# Patient Record
Sex: Male | Born: 1960 | Race: White | Hispanic: No | Marital: Married | Smoking: Former smoker
Health system: Southern US, Community
[De-identification: ages and names within clinical notes are randomized; demographics above are authoritative.]

## PROBLEM LIST (undated history)

## (undated) DIAGNOSIS — F329 Major depressive disorder, single episode, unspecified: Secondary | ICD-10-CM

## (undated) DIAGNOSIS — R4689 Other symptoms and signs involving appearance and behavior: Secondary | ICD-10-CM

## (undated) DIAGNOSIS — J449 Chronic obstructive pulmonary disease, unspecified: Secondary | ICD-10-CM

## (undated) DIAGNOSIS — F419 Anxiety disorder, unspecified: Secondary | ICD-10-CM

## (undated) DIAGNOSIS — N189 Chronic kidney disease, unspecified: Secondary | ICD-10-CM

## (undated) DIAGNOSIS — E039 Hypothyroidism, unspecified: Secondary | ICD-10-CM

## (undated) DIAGNOSIS — J189 Pneumonia, unspecified organism: Secondary | ICD-10-CM

## (undated) DIAGNOSIS — K219 Gastro-esophageal reflux disease without esophagitis: Secondary | ICD-10-CM

## (undated) DIAGNOSIS — E079 Disorder of thyroid, unspecified: Secondary | ICD-10-CM

## (undated) DIAGNOSIS — R4589 Other symptoms and signs involving emotional state: Secondary | ICD-10-CM

## (undated) DIAGNOSIS — G4730 Sleep apnea, unspecified: Secondary | ICD-10-CM

## (undated) DIAGNOSIS — F32A Depression, unspecified: Secondary | ICD-10-CM

## (undated) DIAGNOSIS — R0600 Dyspnea, unspecified: Secondary | ICD-10-CM

## (undated) HISTORY — PX: HERNIA REPAIR: SHX51

## (undated) HISTORY — PX: NEPHRECTOMY: SHX65

## (undated) HISTORY — PX: CHOLECYSTECTOMY: SHX55

---

## 2003-04-29 ENCOUNTER — Emergency Department (HOSPITAL_COMMUNITY): Admission: EM | Admit: 2003-04-29 | Discharge: 2003-04-29 | Payer: Self-pay | Admitting: Emergency Medicine

## 2017-09-09 ENCOUNTER — Emergency Department (HOSPITAL_COMMUNITY)
Admission: EM | Admit: 2017-09-09 | Discharge: 2017-09-10 | Disposition: A | Discharge status: Other Acute Inpatient Hospital | Payer: Medicare HMO | Attending: Psychiatry | Admitting: Psychiatry

## 2017-09-09 ENCOUNTER — Encounter (HOSPITAL_COMMUNITY): Payer: Self-pay | Admitting: *Deleted

## 2017-09-09 DIAGNOSIS — Z87891 Personal history of nicotine dependence: Secondary | ICD-10-CM | POA: Insufficient documentation

## 2017-09-09 DIAGNOSIS — R45851 Suicidal ideations: Secondary | ICD-10-CM

## 2017-09-09 DIAGNOSIS — F329 Major depressive disorder, single episode, unspecified: Secondary | ICD-10-CM | POA: Insufficient documentation

## 2017-09-09 DIAGNOSIS — Z9049 Acquired absence of other specified parts of digestive tract: Secondary | ICD-10-CM | POA: Insufficient documentation

## 2017-09-09 HISTORY — DX: Other symptoms and signs involving emotional state: R45.89

## 2017-09-09 HISTORY — DX: Major depressive disorder, single episode, unspecified: F32.9

## 2017-09-09 HISTORY — DX: Disorder of thyroid, unspecified: E07.9

## 2017-09-09 HISTORY — DX: Depression, unspecified: F32.A

## 2017-09-09 HISTORY — DX: Other symptoms and signs involving appearance and behavior: R46.89

## 2017-09-09 LAB — RAPID URINE DRUG SCREEN, HOSP PERFORMED
Amphetamines: NOT DETECTED
Barbiturates: NOT DETECTED
Benzodiazepines: NOT DETECTED
Cocaine: NOT DETECTED
Opiates: NOT DETECTED
Tetrahydrocannabinol: NOT DETECTED

## 2017-09-09 LAB — COMPREHENSIVE METABOLIC PANEL
ALT: 20 U/L (ref 17–63)
AST: 24 U/L (ref 15–41)
Albumin: 4.2 g/dL (ref 3.5–5.0)
Alkaline Phosphatase: 105 U/L (ref 38–126)
Anion gap: 9 (ref 5–15)
BILIRUBIN TOTAL: 0.7 mg/dL (ref 0.3–1.2)
BUN: 19 mg/dL (ref 6–20)
CO2: 27 mmol/L (ref 22–32)
CREATININE: 1.41 mg/dL — AB (ref 0.61–1.24)
Calcium: 9.3 mg/dL (ref 8.9–10.3)
Chloride: 105 mmol/L (ref 101–111)
GFR calc Af Amer: >60 mL/min (ref >60)
GFR, EST NON AFRICAN AMERICAN: 55 mL/min — AB (ref >60)
Glucose, Bld: 103 mg/dL — ABNORMAL HIGH (ref 65–99)
Potassium: 4 mmol/L (ref 3.5–5.1)
Sodium: 141 mmol/L (ref 135–145)
TOTAL PROTEIN: 7.4 g/dL (ref 6.5–8.1)

## 2017-09-09 LAB — CBC
HCT: 44.8 % (ref 39.0–52.0)
Hemoglobin: 14.4 g/dL (ref 13.0–17.0)
MCH: 28.9 pg (ref 26.0–34.0)
MCHC: 32.1 g/dL (ref 30.0–36.0)
MCV: 90 fL (ref 78.0–100.0)
PLATELETS: 191 10*3/uL (ref 150–400)
RBC: 4.98 MIL/uL (ref 4.22–5.81)
RDW: 13.9 % (ref 11.5–15.5)
WBC: 9 10*3/uL (ref 4.0–10.5)

## 2017-09-09 LAB — SALICYLATE LEVEL: Salicylate Lvl: <7 mg/dL (ref 2.8–30.0)

## 2017-09-09 LAB — ACETAMINOPHEN LEVEL: Acetaminophen (Tylenol), Serum: <10 ug/mL — ABNORMAL LOW (ref 10–30)

## 2017-09-09 LAB — ETHANOL

## 2017-09-09 MED ORDER — ASPIRIN EC 81 MG PO TBEC
81.0000 mg | DELAYED_RELEASE_TABLET | Freq: Every day | ORAL | Status: DC
  Administered 2017-09-09: 81 mg via ORAL
  Filled 2017-09-09: qty 1

## 2017-09-09 MED ORDER — BUSPIRONE HCL 10 MG PO TABS
10.0000 mg | ORAL_TABLET | Freq: Three times a day (TID) | ORAL | Status: DC
  Administered 2017-09-09 – 2017-09-10 (×3): 10 mg via ORAL
  Filled 2017-09-09 (×3): qty 1

## 2017-09-09 MED ORDER — ONDANSETRON HCL 4 MG PO TABS: 4.0000 mg | ORAL_TABLET | Freq: Three times a day (TID) | ORAL | Status: DC | PRN

## 2017-09-09 MED ORDER — DOCUSATE SODIUM 100 MG PO CAPS: 100.0000 mg | ORAL_CAPSULE | Freq: Every day | ORAL | Status: DC | PRN

## 2017-09-09 MED ORDER — FAMOTIDINE 20 MG PO TABS
20.0000 mg | ORAL_TABLET | Freq: Every morning | ORAL | Status: DC
  Administered 2017-09-10: 20 mg via ORAL
  Filled 2017-09-09: qty 1

## 2017-09-09 MED ORDER — ACETAMINOPHEN 325 MG PO TABS: 650.0000 mg | ORAL_TABLET | ORAL | Status: DC | PRN

## 2017-09-09 MED ORDER — ACETAMINOPHEN 325 MG PO TABS: 325.0000 mg | ORAL_TABLET | Freq: Four times a day (QID) | ORAL | Status: DC | PRN

## 2017-09-09 MED ORDER — RISPERIDONE 0.5 MG PO TABS
0.5000 mg | ORAL_TABLET | Freq: Every day | ORAL | Status: DC
  Administered 2017-09-09: 0.5 mg via ORAL
  Filled 2017-09-09: qty 1

## 2017-09-09 MED ORDER — CITALOPRAM HYDROBROMIDE 10 MG PO TABS
20.0000 mg | ORAL_TABLET | ORAL | Status: DC
  Administered 2017-09-10: 20 mg via ORAL
  Filled 2017-09-09: qty 2

## 2017-09-09 MED ORDER — ATORVASTATIN CALCIUM 40 MG PO TABS
40.0000 mg | ORAL_TABLET | Freq: Every day | ORAL | Status: DC
  Administered 2017-09-09: 40 mg via ORAL
  Filled 2017-09-09: qty 1

## 2017-09-09 MED ORDER — LEVOTHYROXINE SODIUM 112 MCG PO TABS
112.0000 ug | ORAL_TABLET | Freq: Every day | ORAL | Status: DC
  Administered 2017-09-10: 112 ug via ORAL
  Filled 2017-09-09: qty 1
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
## 2017-09-09 NOTE — ED Notes (Signed)
Pt and spouse aware TTS to be performed soon. Pt c/o "I'm hurting all over". States "it just started". Pt then stated his posterior neck was itching - tag removed from shirt - spouse states she thinks that was the issue. Pt stated "If this keeps up, I'm not going to be able to sleep tonight". Advised pt it's a little early to be worrying about that. Pt states, "Well, I zonk out anyway". Pt and spouse watching tv and talking.
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
## 2017-09-09 NOTE — ED Notes (Signed)
Pt and spouse aware of tx plan - seeking inpt tx. Spouse leaving and taking all of pt's belongings w/her. Pt only has eyeglasses on bedside table.

## 2017-09-10 ENCOUNTER — Inpatient Hospital Stay (HOSPITAL_COMMUNITY)
Admission: AD | Admit: 2017-09-10 | Discharge: 2017-09-15 | DRG: 885 | Disposition: A | Discharge status: Home / Self Care | Payer: Medicare HMO | Source: Intra-hospital | Attending: Psychiatry | Admitting: Psychiatry

## 2017-09-10 ENCOUNTER — Encounter (HOSPITAL_COMMUNITY): Payer: Self-pay | Admitting: *Deleted

## 2017-09-10 DIAGNOSIS — F39 Unspecified mood [affective] disorder: Secondary | ICD-10-CM | POA: Diagnosis not present

## 2017-09-10 DIAGNOSIS — E039 Hypothyroidism, unspecified: Secondary | ICD-10-CM | POA: Diagnosis present

## 2017-09-10 DIAGNOSIS — R5383 Other fatigue: Secondary | ICD-10-CM | POA: Diagnosis not present

## 2017-09-10 DIAGNOSIS — R4584 Anhedonia: Secondary | ICD-10-CM | POA: Diagnosis not present

## 2017-09-10 DIAGNOSIS — Z630 Problems in relationship with spouse or partner: Secondary | ICD-10-CM | POA: Diagnosis not present

## 2017-09-10 DIAGNOSIS — Z91040 Latex allergy status: Secondary | ICD-10-CM | POA: Diagnosis not present

## 2017-09-10 DIAGNOSIS — G4700 Insomnia, unspecified: Secondary | ICD-10-CM | POA: Diagnosis present

## 2017-09-10 DIAGNOSIS — F419 Anxiety disorder, unspecified: Secondary | ICD-10-CM | POA: Diagnosis present

## 2017-09-10 DIAGNOSIS — R45851 Suicidal ideations: Secondary | ICD-10-CM | POA: Diagnosis present

## 2017-09-10 DIAGNOSIS — F1910 Other psychoactive substance abuse, uncomplicated: Secondary | ICD-10-CM | POA: Diagnosis not present

## 2017-09-10 DIAGNOSIS — F329 Major depressive disorder, single episode, unspecified: Secondary | ICD-10-CM | POA: Diagnosis not present

## 2017-09-10 DIAGNOSIS — R450 Nervousness: Secondary | ICD-10-CM | POA: Diagnosis not present

## 2017-09-10 DIAGNOSIS — Z87891 Personal history of nicotine dependence: Secondary | ICD-10-CM | POA: Diagnosis not present

## 2017-09-10 DIAGNOSIS — F332 Major depressive disorder, recurrent severe without psychotic features: Principal | ICD-10-CM | POA: Diagnosis present

## 2017-09-10 MED ORDER — CITALOPRAM HYDROBROMIDE 20 MG PO TABS
20.0000 mg | ORAL_TABLET | ORAL | Status: DC
  Administered 2017-09-11 – 2017-09-15 (×5): 20 mg via ORAL
  Filled 2017-09-10 (×9): qty 1

## 2017-09-10 MED ORDER — ALUM & MAG HYDROXIDE-SIMETH 200-200-20 MG/5ML PO SUSP: 30.0000 mL | ORAL | Status: DC | PRN

## 2017-09-10 MED ORDER — LEVOTHYROXINE SODIUM 112 MCG PO TABS
112.0000 ug | ORAL_TABLET | Freq: Every day | ORAL | Status: DC
  Administered 2017-09-11 – 2017-09-15 (×5): 112 ug via ORAL
  Filled 2017-09-10 (×8): qty 1

## 2017-09-10 MED ORDER — DOCUSATE SODIUM 100 MG PO CAPS
100.0000 mg | ORAL_CAPSULE | Freq: Every day | ORAL | Status: DC | PRN
  Administered 2017-09-10 – 2017-09-11 (×2): 100 mg via ORAL
  Filled 2017-09-10 (×2): qty 1

## 2017-09-10 MED ORDER — BUSPIRONE HCL 10 MG PO TABS
10.0000 mg | ORAL_TABLET | Freq: Three times a day (TID) | ORAL | Status: DC
  Administered 2017-09-11 – 2017-09-15 (×14): 10 mg via ORAL
  Filled 2017-09-10 (×19): qty 1

## 2017-09-10 MED ORDER — ACETAMINOPHEN 325 MG PO TABS
650.0000 mg | ORAL_TABLET | Freq: Four times a day (QID) | ORAL | Status: DC | PRN
  Administered 2017-09-11: 650 mg via ORAL
  Filled 2017-09-10: qty 2

## 2017-09-10 MED ORDER — MAGNESIUM HYDROXIDE 400 MG/5ML PO SUSP: 30.0000 mL | Freq: Every day | ORAL | Status: DC | PRN

## 2017-09-10 MED ORDER — ONDANSETRON HCL 4 MG PO TABS: 4.0000 mg | ORAL_TABLET | Freq: Three times a day (TID) | ORAL | Status: DC | PRN

## 2017-09-10 MED ORDER — TRAZODONE HCL 50 MG PO TABS
50.0000 mg | ORAL_TABLET | Freq: Every evening | ORAL | Status: DC | PRN
  Administered 2017-09-10: 50 mg via ORAL
  Filled 2017-09-10 (×7): qty 1

## 2017-09-10 MED ORDER — ATORVASTATIN CALCIUM 40 MG PO TABS
40.0000 mg | ORAL_TABLET | Freq: Every day | ORAL | Status: DC
  Administered 2017-09-10 – 2017-09-14 (×5): 40 mg via ORAL
  Filled 2017-09-10 (×8): qty 1

## 2017-09-10 MED ORDER — INFLUENZA VAC SPLIT QUAD 0.5 ML IM SUSY
0.5000 mL | PREFILLED_SYRINGE | INTRAMUSCULAR | Status: DC
  Filled 2017-09-10: qty 0.5

## 2017-09-10 MED ORDER — RISPERIDONE 0.5 MG PO TABS
0.5000 mg | ORAL_TABLET | Freq: Every day | ORAL | Status: DC
  Administered 2017-09-10 – 2017-09-14 (×5): 0.5 mg via ORAL
  Filled 2017-09-10 (×8): qty 1

## 2017-09-10 MED ORDER — HYDROXYZINE HCL 25 MG PO TABS
25.0000 mg | ORAL_TABLET | Freq: Four times a day (QID) | ORAL | Status: DC | PRN
  Administered 2017-09-10 – 2017-09-12 (×3): 25 mg via ORAL
  Filled 2017-09-10 (×3): qty 1

## 2017-09-10 MED ORDER — ASPIRIN EC 81 MG PO TBEC
81.0000 mg | DELAYED_RELEASE_TABLET | Freq: Every day | ORAL | Status: DC
  Administered 2017-09-10 – 2017-09-14 (×5): 81 mg via ORAL
  Filled 2017-09-10 (×8): qty 1

## 2017-09-10 MED ORDER — FAMOTIDINE 20 MG PO TABS
20.0000 mg | ORAL_TABLET | Freq: Every morning | ORAL | Status: DC
  Administered 2017-09-11 – 2017-09-15 (×5): 20 mg via ORAL
  Filled 2017-09-10 (×7): qty 1

## 2017-09-10 NOTE — Progress Notes (Signed)
Devin Joseph is a 56 year old male pt admitted on voluntary basis. On admission, Devin Joseph does endorse suicidal thoughts but is able to contract for safety while in the hospital. He reports that he was recently at [facility] and reports that he feels that he wasn't helped while he was there. He reports taking his medications as prescribed and reports that he has been on his medications for awhile and feels they may need to be changed. He reports that he lives at home with his wife and his mother and reports he will go there after discharge. Devin Joseph was oriented to the unit and safety maintained.

## 2017-09-10 NOTE — ED Notes (Signed)
Pt asking to shower - supplies obtained - pt states he will later.

## 2017-09-10 NOTE — Progress Notes (Signed)
  DATA ACTION RESPONSE  Objective- Pt. is visible in the dayroom, seen interacting with peers. Presents with an animated/anxious affect and mood. Appropriate with interaction.  Subjective- Denies having any SI/HI/AVH/Pain at this time. Pt states "I can be in this cationic state where I stare at the ceiling for a few hours; It usually happens at night". Is cooperative and remain safe on the unit.  1:1 interaction in private to establish rapport. Encouragement, education, & support given from staff.  PRN colace and vistaril requested and will re-eval accordingly.   Safety maintained with Q 15 checks. Continue with POC.

## 2017-09-10 NOTE — ED Notes (Signed)
Pt given crackers, peanut butter, and Caff-Free Coke as requested for snack.

## 2017-09-10 NOTE — ED Notes (Addendum)
Pt voiced agreement w/tx plan - accepted to [facility] - signed consent forms - copy faxed to [facility], copy sent to Medical Records, and original placed in envelope for [facility]. Called Lupita Leash, spouse, and notified as pt requested.

## 2017-09-10 NOTE — Plan of Care (Signed)
Problem: Safety: Goal: Periods of time without injury will increase Outcome: Progressing Pt remains a moderate fall risk, denies SI/HI at this time.

## 2017-09-10 NOTE — Tx Team (Signed)
Initial Treatment Plan 09/10/2017 6:14 PM Devin Joseph GEX:[id number]    PATIENT STRESSORS: Marital or family conflict Medication change or noncompliance   PATIENT STRENGTHS: Ability for insight Average or above average intelligence Capable of independent living General fund of knowledge Motivation for treatment/growth   PATIENT IDENTIFIED PROBLEMS: Depression Suicidal thoughts "Try to get situated so I don't do what I tried to do at the house" "been on medications for awhile and may need a change"                     DISCHARGE CRITERIA:  Ability to meet basic life and health needs Improved stabilization in mood, thinking, and/or behavior Verbal commitment to aftercare and medication compliance  PRELIMINARY DISCHARGE PLAN: Attend aftercare/continuing care group Return to previous living arrangement  PATIENT/FAMILY INVOLVEMENT: This treatment plan has been presented to and reviewed with the patient, Devin Joseph, and/or family member, .  The patient and family have been given the opportunity to ask questions and make suggestions.  Devin Joseph, [place], [place] 09/10/2017, 6:14 PM

## 2017-09-10 NOTE — ED Notes (Signed)
Rayfield Citizen, RN - [facility] requested for pt to arrive around 1630.

## 2017-09-10 NOTE — Progress Notes (Signed)
Per Thurman Coyer , [facility], patient has been accepted to [facility], bed 406-1 ; Accepting provider is Assunta Found, NP; Attending provider is Dr. Jama Flavors.  Patient can arrive at 3:00pm. Number for report is [phone].   Jerrol Banana, RN notified.   Baldo Daub MSW, LCSWA CSW Disposition [phone]

## 2017-09-11 DIAGNOSIS — R5383 Other fatigue: Secondary | ICD-10-CM

## 2017-09-11 DIAGNOSIS — F332 Major depressive disorder, recurrent severe without psychotic features: Principal | ICD-10-CM

## 2017-09-11 DIAGNOSIS — R4584 Anhedonia: Secondary | ICD-10-CM

## 2017-09-11 DIAGNOSIS — Z630 Problems in relationship with spouse or partner: Secondary | ICD-10-CM

## 2017-09-11 DIAGNOSIS — G4700 Insomnia, unspecified: Secondary | ICD-10-CM

## 2017-09-11 DIAGNOSIS — R45851 Suicidal ideations: Secondary | ICD-10-CM

## 2017-09-11 MED ORDER — TRAZODONE HCL 50 MG PO TABS
50.0000 mg | ORAL_TABLET | Freq: Every evening | ORAL | Status: DC | PRN
  Administered 2017-09-11 – 2017-09-12 (×2): 50 mg via ORAL
  Filled 2017-09-11 (×2): qty 1

## 2017-09-11 NOTE — [facility] Group Notes (Signed)
LCSW Group Therapy Note  09/11/2017 1:15pm  Type of Therapy/Topic:  Group Therapy:  Balance in Life  Participation Level:  Active  Description of Group:    This group will address the concept of balance and how it feels and looks when one is unbalanced. Patients will be encouraged to process areas in their lives that are out of balance and identify reasons for remaining unbalanced. Facilitators will guide patients in utilizing problem-solving interventions to address and correct the stressor making their life unbalanced. Understanding and applying boundaries will be explored and addressed for obtaining and maintaining a balanced life. Patients will be encouraged to explore ways to assertively make their unbalanced needs known to significant others in their lives, using other group members and facilitator for support and feedback.  Therapeutic Goals: 1. Patient will identify two or more emotions or situations they have that consume much of in their lives. 2. Patient will identify signs/triggers that life has become out of balance:  3. Patient will identify two ways to set boundaries in order to achieve balance in their lives:  4. Patient will demonstrate ability to communicate their needs through discussion and/or role plays  Summary of Patient Progress: Pt came in towards the end of group. Pt participated in an activity where the pts were asked to pick a picture that represents balance to them. Pt picked a picture of outer space with a man balancing another person on his head. Pt states that he picked this picture because he is has experienced catatonia and it feels very similar to being in outer space. Pt reports that it happens to him every morning when he wakes up.   Therapeutic Modalities:   Cognitive Behavioral Therapy Solution-Focused Therapy Assertiveness Training  Jonathon Jordan, MSW, LCSWA 09/11/2017 3:52 PM

## 2017-09-11 NOTE — [facility] Counselor (Signed)
Adult Comprehensive Assessment  Patient ID: ARMARI FUSSELL, male   DOB: 1961-07-17, 56 y.o.   MRN: [id number]  Information Source: Information source: Patient  Current Stressors:  Educational / Learning stressors: High school education  Employment / Job issues: On disability due to IDD Family Relationships: Pt has a 67 yo daughter whom he does not get to see as often as he would like and this is very distressing to him  Financial / Lack of resources (include bankruptcy): Limited income  Housing / Lack of housing: None reported  Physical health (include injuries & life threatening diseases): None reported  Social relationships: Conflictual relationship with his wife  Substance abuse: Alcohol use  Bereavement / Loss: None reported   Living/Environment/Situation:  Living Arrangements: Spouse/significant other, Parent Living conditions (as described by patient or guardian): Pt lives with his wife and his mother  How long has patient lived in current situation?: "Our entire marriage" What is atmosphere in current home: Comfortable  Family History:  Marital status: Married Number of Years Married: 12 What types of issues is patient dealing with in the relationship?: "We don't always get along. There's been points when I just want to tell her I'm done"  Childhood History:  By whom was/is the patient raised?: Mother Additional childhood history information: Pt describes his childhood as "poor" Description of patient's relationship with caregiver when they were a child: "She's always been there when I need her" Patient's description of current relationship with people who raised him/her: Pt still has a close relaitonship with his mother  Does patient have siblings?: Yes Number of Siblings: 3 (2 brothers, 1 sister ) Description of patient's current relationship with siblings: "They always work all the time. They don't come to see me that often" Did patient suffer any  verbal/emotional/physical/sexual abuse as a child?:  ("I don't know.Marland KitchenMarland KitchenI couldn't say that far back. My memory ain't that good") Did patient suffer from severe childhood neglect?: No Has patient ever been sexually abused/assaulted/raped as an adolescent or adult?: No Was the patient ever a victim of a crime or a disaster?: No Witnessed domestic violence?: No Has patient been effected by domestic violence as an adult?: No  Education:  Highest grade of school patient has completed: High school  Currently a student?: No Learning disability?: Yes What learning problems does patient have?: Pt states that he always struggling with reading and math, pt also struggles with comprehension   Employment/Work Situation:   Employment situation: On disability Why is patient on disability: IDD How long has patient been on disability: Since pt was 56 yo What is the longest time patient has a held a job?: 2 years  Where was the patient employed at that time?: [employer]  Has patient ever been in the [employer]?: No Has patient ever served in combat?: No Did You Receive Any Psychiatric Treatment/Services While in Equities trader?:  (NA) Are There Guns or Other Weapons in Your Home?: Yes Types of Guns/Weapons: Pt has access to knives. Pt stated "If I'm discharged I'm afraid I'm going to just go to the knife drawer and do it up" Are These Weapons Safely Secured?: No Who Could Verify You Are Able To Have These Secured:: Pt's wife will be called and asked to secure the knives   Financial Resources:   Financial resources: Receives SSDI  Alcohol/Substance Abuse:   What has been your use of drugs/alcohol within the last 12 months?: Pt drinks whole cases of beer on the weekends  If attempted suicide,  did drugs/alcohol play a role in this?: Yes Alcohol/Substance Abuse Treatment Hx: Denies past history Has alcohol/substance abuse ever caused legal problems?: No  Social Support System:   Patient's Community  Support System: Fair Describe Community Support System: "I don't have any.Marland KitchenMarland KitchenI just have a therapist I can call" Type of faith/religion: Baptist  How does patient's faith help to cope with current illness?: "It's not"  Leisure/Recreation:   Leisure and Hobbies: Video games   Strengths/Needs:   What things does the patient do well?: Taking care of his dogs  In what areas does patient struggle / problems for patient: "Trying to get better"  Discharge Plan:   Does patient have access to transportation?: Yes (Pt's wife wil ltransport ) Will patient be returning to same living situation after discharge?: Yes Currently receiving community mental health services: Yes (From Whom) (Dr. Joni Reining (doesn't know any other information)) Does patient have financial barriers related to discharge medications?: Yes Patient description of barriers related to discharge medications: Limited income   Summary/Recommendations:     Patient is a 56 yo male who presented to the hospital with depression and SI with a plan to cut his throat with a knife. Pt's primary diagnosis is Major Depressive Disorder. Primary triggers for admission include marital conflict, and not being able to see his 76 yo daughter for an extended period of time. During the time of the assessment pt was alert and oriented, pleasant, and forthcoming with information. Pt is hard of hearing and IDD so needed several questions reworded and repeated. Pt is agreeable to continuing treatment on an outpatient basis upon discharge. Pt states that his doctor is his main support. Patient will benefit from crisis stabilization, medication evaluation, group therapy and pyschoeducation, in addition to case management for discharge planning. At discharge, it is recommended that pt remain compliant with the established discharge plan and continue treatment.   Jonathon Jordan, MSW, Theresia Majors  09/11/2017

## 2017-09-11 NOTE — Progress Notes (Signed)
Adult Psychoeducational Group Note  Date:  09/11/2017 Time:  9:57 PM  Group Topic/Focus:  Wrap-Up Group:   The focus of this group is to help patients review their daily goal of treatment and discuss progress on daily workbooks.  Participation Level:  Active  Participation Quality:  Appropriate, Sharing and Supportive  Affect:  Appropriate  Cognitive:  Appropriate and Oriented  Insight: Appropriate and Good  Engagement in Group:  Engaged and Supportive  Modes of Intervention:  Support  Additional Comments:  Devin Joseph shared with the group that he had a good day. His wife came to visit him and brought him some clothing.    Devin Joseph [place] 09/11/2017, 9:57 PM
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
## 2017-09-11 NOTE — Progress Notes (Signed)
D:Pt rates depression, anxiety and hopelessness as a 10 on 0-10 scale with 10 being t he most. Pt reports passive si thoughts with a plan to cut. He has been in bed most of the morning and c/o belly pain. A:Offered support, encouragement and 15 minute checks. Gave prn medication for pain.  R:Pt contracts with staff for safety. Safety maintained on the unit.

## 2017-09-11 NOTE — Progress Notes (Signed)
DATA ACTION RESPONSE  Objective- Pt. is visible in the dayroom, seen interacting and engaged with peers. Presents with an animated/anxious affect and mood. Appropriate with interaction.  Subjective- Denies having any HI/AVH/Pain at this time. Endorses passive SI but verbal contracts for safety. Is cooperative and remain safe on the unit.  1:1 interaction in private to establish rapport. Encouragement, education, & support given from staff.  PRN colace, vistaril, and trazodone requested and will re-eval accordingly.   Safety maintained with Q 15 checks. Continue with POC.

## 2017-09-11 NOTE — Plan of Care (Signed)
Problem: Activity: Goal: Sleeping patterns will improve Outcome: Progressing Pt slept 6.75 hrs last night.    
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
## 2017-09-12 DIAGNOSIS — F419 Anxiety disorder, unspecified: Secondary | ICD-10-CM

## 2017-09-12 DIAGNOSIS — Z87891 Personal history of nicotine dependence: Secondary | ICD-10-CM

## 2017-09-12 DIAGNOSIS — F1910 Other psychoactive substance abuse, uncomplicated: Secondary | ICD-10-CM

## 2017-09-12 DIAGNOSIS — F39 Unspecified mood [affective] disorder: Secondary | ICD-10-CM

## 2017-09-12 DIAGNOSIS — R450 Nervousness: Secondary | ICD-10-CM

## 2017-09-12 LAB — TSH: TSH: 3.896 u[IU]/mL (ref 0.350–4.500)

## 2017-09-12 NOTE — [facility] Suicide Risk Assessment (Signed)
[facility] INPATIENT:  Family/Significant Other Suicide Prevention Education  Suicide Prevention Education:  Contact Attempts: Dima Ferrufino (wife [phone]), has been identified by the patient as the family member/significant other with whom the patient will be residing, and identified as the person(s) who will aid the patient in the event of a mental health crisis.  With written consent from the patient, two attempts were made to provide suicide prevention education, prior to and/or following the patient's discharge.  We were unsuccessful in providing suicide prevention education.  A suicide education pamphlet was given to the patient to share with family/significant other.  Date and time of first attempt: 09/12/17 at 2:55 pm. No answer, CSW left a message.    Jonathon Jordan, MSW, LCSWA  09/12/2017, 2:54 PM

## 2017-09-12 NOTE — Progress Notes (Signed)
Devin Joseph is seen standing at the med window this morning. HE was sleeping and writer called his name 4 ( four) times before he awakened. Staff report patient is HOH and does not have his hearing aides available to him while in the hospital. He shuffles down the hall tot he medication window and he says " hugh?" " what" . A He is observed taking his morning meds as scheduled. He completed his daily assessment and on this he wrote he has experienced SI today ( but he contracts with this Clinical research associate to not hurt himslef).  He rates his depression, hopelessness and anxeity " 7/7/7/", respectively. RSafety is in place

## 2017-09-12 NOTE — [facility] Group Notes (Signed)
LCSW Group Therapy Note  09/12/2017 1:15pm  Type of Therapy and Topic:  Group Therapy:  Feelings around Relapse and Recovery  Participation Level: Pt invited. Did not attend.   Jonathon Jordan, MSW, LCSWA 09/12/2017 2:24 PM

## 2017-09-12 NOTE — Progress Notes (Signed)
D.  Pt in bed on approach, denies complaints at this time.  Pt did not get up for evening wrap up group, has remained in bed so far this shift.  Pt denies SI/HI/AVH at this time.  A.  Support and encouragement offered, medication given as ordered  R.  Pt remains safe on the unit, will continue to monitor.

## 2017-09-12 NOTE — Progress Notes (Signed)
Recreation Therapy Notes  Date: 09/12/17 Time: 0930 Location: 500 Hall Dayroom  Group Topic: Stress Management  Goal Area(s) Addresses:  Patient will verbalize importance of using healthy stress management.  Patient will identify positive emotions associated with healthy stress management.   Intervention: Stress Management  Activity :  Guided Imagery.  LRT introduced the stress management technique of guided imagery.  Patients were to listen and follow along as LRT read a script to lead patients on a mental vacation through a meadow.    Education:  Stress Management, Discharge Planning.   Education Outcome: Acknowledges edcuation/In group clarification offered/Needs additional education  Clinical Observations/Feedback: Pt did not attend group.    Brycelyn Gambino, LRT/CTRS         Charlayne Vultaggio A 09/12/2017 11:17 AM 

## 2017-09-12 NOTE — Progress Notes (Signed)
[facility] MD Progress Note  09/12/2017 2:35 PM Devin Joseph  MRN:  [id number]  Subjective: [name] reports, "I keep thinking about hurting myself a lot. I'm just so depressed, but, I don't know why. I have been here 3 days already, I still do not feel any better. I'm taking the medicines, I'm not sure if they are helping. I don't feel like getting out of bed. I don't feel like coming out of my room. I don't feel like going to the group sessions. I'm just here, that is all about it".  Objective: [name] is seen, chart reviewed. He is lying down in his bed. He is not making any eye contact. He continues to endorse worsening depression & suicidal ideations. Able to contract for safety verbally. He is not visible on the unit or participating in any group sessions. He currently denies any AVH or HI. He does not appear to be responding to any internal stimuli. Staff continue to provided support.  Principal Problem: Major depressive disorder, severe without psychotic features.  Diagnosis:   Patient Active Problem List   Diagnosis Date Noted  . Severe recurrent major depression without psychotic features (HCC) [F33.2] 09/10/2017   Total Time spent with patient: 25 minutes  Past Psychiatric History: Mdd.  Past Medical History:  Past Medical History:  Diagnosis Date  . Depression   . Suicidal behavior without attempted self-injury   . Thyroid disease     Past Surgical History:  Procedure Laterality Date  . CHOLECYSTECTOMY    . HERNIA REPAIR     Family History: History reviewed. No pertinent family history.  Family Psychiatric  History: See H&P  Social History:  History  Alcohol Use No     History  Drug Use No    Social History   Social History  . Marital status: Single    Spouse name: N/A  . Number of children: N/A  . Years of education: N/A   Social History Main Topics  . Smoking status: Former Games developer  . Smokeless tobacco: Never Used  . Alcohol use No  . Drug use: No  . Sexual  activity: Not Asked   Other Topics Concern  . None   Social History Narrative  . None   Additional Social History:   Sleep: Good  Appetite:  Good  Current Medications: Current Facility-Administered Medications  Medication Dose Route Frequency Provider Last Rate Last Dose  . acetaminophen (TYLENOL) tablet 650 mg  650 mg Oral Q6H PRN Kerry Hough, PA-C   650 mg at 09/11/17 7829  . alum & mag hydroxide-simeth (MAALOX/MYLANTA) 200-200-20 MG/5ML suspension 30 mL  30 mL Oral Q4H PRN Rankin, Shuvon B, NP      . aspirin EC tablet 81 mg  81 mg Oral QHS Rankin, Shuvon B, NP   81 mg at 09/11/17 2233  . atorvastatin (LIPITOR) tablet 40 mg  40 mg Oral QHS Rankin, Shuvon B, NP   40 mg at 09/11/17 2233  . busPIRone (BUSPAR) tablet 10 mg  10 mg Oral TID Rankin, Shuvon B, NP   10 mg at 09/12/17 1213  . citalopram (CELEXA) tablet 20 mg  20 mg Oral BH-q7a Rankin, Shuvon B, NP   20 mg at 09/12/17 5621  . docusate sodium (COLACE) capsule 100 mg  100 mg Oral Daily PRN Rankin, Shuvon B, NP   100 mg at 09/11/17 2234  . famotidine (PEPCID) tablet 20 mg  20 mg Oral q morning - 10a Rankin, Shuvon B, NP  20 mg at 09/12/17 0834  . hydrOXYzine (ATARAX/VISTARIL) tablet 25 mg  25 mg Oral Q6H PRN Kerry Hough, PA-C   25 mg at 09/11/17 2234  . Influenza vac split quadrivalent PF (FLUARIX) injection 0.5 mL  0.5 mL Intramuscular Tomorrow-1000 Cobos, Fernando A, MD      . levothyroxine (SYNTHROID, LEVOTHROID) tablet 112 mcg  112 mcg Oral QAC breakfast Rankin, Shuvon B, NP   112 mcg at 09/12/17 1610  . magnesium hydroxide (MILK OF MAGNESIA) suspension 30 mL  30 mL Oral Daily PRN Rankin, Shuvon B, NP      . ondansetron (ZOFRAN) tablet 4 mg  4 mg Oral Q8H PRN Rankin, Shuvon B, NP      . risperiDONE (RISPERDAL) tablet 0.5 mg  0.5 mg Oral QHS Rankin, Shuvon B, NP   0.5 mg at 09/11/17 2234  . traZODone (DESYREL) tablet 50 mg  50 mg Oral QHS PRN Cobos, Rockey Situ, MD   50 mg at 09/11/17 2234   Lab Results: No results  found for this or any previous visit (from the past 48 hour(s)).  Blood Alcohol level:  Lab Results  Component Value Date   ETH <10 (H) 09/09/2017   Metabolic Disorder Labs: No results found for: HGBA1C, MPG No results found for: PROLACTIN No results found for: CHOL, TRIG, HDL, CHOLHDL, VLDL, LDLCALC  Physical Findings: AIMS: Facial and Oral Movements Muscles of Facial Expression: None, normal Lips and Perioral Area: None, normal Jaw: None, normal Tongue: None, normal,Extremity Movements Upper (arms, wrists, hands, fingers): None, normal Lower (legs, knees, ankles, toes): None, normal, Trunk Movements Neck, shoulders, hips: None, normal, Overall Severity Severity of abnormal movements (highest score from questions above): None, normal Incapacitation due to abnormal movements: None, normal Patient's awareness of abnormal movements (rate only patient's report): No Awareness, Dental Status Current problems with teeth and/or dentures?: No Does patient usually wear dentures?: No  CIWA:    COWS:     Musculoskeletal: Strength & Muscle Tone: within normal limits Gait & Station: normal Patient leans: N/A  Psychiatric Specialty Exam: Physical Exam  Nursing note and vitals reviewed.   Review of Systems  Psychiatric/Behavioral: Positive for depression ("I feel worse today"), substance abuse (Hx. alcoholism) and suicidal ideas. Negative for hallucinations and memory loss. The patient is nervous/anxious. The patient does not have insomnia.   All other systems reviewed and are negative.   Blood pressure 90/65, pulse 83, temperature 98.5 F (36.9 C), temperature source Oral, resp. rate 17, height  (1.626 m), weight 85.3 kg (188 lb).Body mass index is 32.27 kg/m.  General Appearance: Fairly Groomed  Eye Contact:  Good  Speech:  Normal Rate  Volume:  Normal  Mood: Reports he continues to feel depressed, but feels better since admission  Affect:  constricted but does smile at  times appropriately   Thought Process:  Linear and Descriptions of Associations: Circumstantial  Orientation:  Other:  fully alert and attentive   Thought Content:  no hallucinations, no delusions, not internally preoccupied   Suicidal Thoughts:  No denies current suicidal ideations, denies homicidal or violent ideations, contracts for safety on unit at this time   Homicidal Thoughts:  No  Memory:  recent and remote grossly intact   Judgement:  Fair  Insight:  Fair  Psychomotor Activity:  Normal  Concentration:  Concentration: Good and Attention Span: Good  Recall:  Good  Fund of Knowledge:  Good  Language:  Good  Akathisia:  Negative  Handed:  Right  AIMS (if indicated):     Assets:  Communication Skills Desire for Improvement Resilience  ADL's:  Intact  Cognition:  WNL  Sleep:  Number of Hours: 6.75     Treatment Plan Summary: Daily contact with patient to assess and evaluate symptoms and progress in treatment -Continue Buspar 10 mg tid for anxiety. -Continue Citalopram 20 mg daily for depression. -Continue Hydroxyzine 25 mg prn Q 6 hours for anxiety. -Continue Risperdal 0.5 mg Q hs for mood control. -Continue Trazodone 50 mg prn Q hs for sleep. - Continue 15 minutes observation for safety concerns - Encouraged to participate in milieu therapy and group therapy counseling sessions and also work with coping skills -  Develop treatment plan to decrease risk of relapse upon discharge and to reduce the need for readmission. -  Psycho-social education regarding relapse prevention and self care. - Health care follow up as needed for medical problems. - Restart home medications where appropriate.  Sanjuana Kava, NP, PMHNP, FNP-BC. 09/12/2017, 2:35 PM   Agree with NP Progress Note

## 2017-09-12 NOTE — Tx Team (Signed)
Interdisciplinary Treatment and Diagnostic Plan Update 09/12/2017 Time of Session: 9:30am  Devin Joseph  MRN: [id number]  Principal Diagnosis: MDD, recurrent, no psychotic features   Secondary Diagnoses: Active Problems:   Severe recurrent major depression without psychotic features (HCC)   Current Medications:  Current Facility-Administered Medications  Medication Dose Route Frequency Provider Last Rate Last Dose  . acetaminophen (TYLENOL) tablet 650 mg  650 mg Oral Q6H PRN Laverle Hobby, PA-C   650 mg at 09/11/17 8325  . alum & mag hydroxide-simeth (MAALOX/MYLANTA) 200-200-20 MG/5ML suspension 30 mL  30 mL Oral Q4H PRN Rankin, Shuvon B, NP      . aspirin EC tablet 81 mg  81 mg Oral QHS Rankin, Shuvon B, NP   81 mg at 09/11/17 2233  . atorvastatin (LIPITOR) tablet 40 mg  40 mg Oral QHS Rankin, Shuvon B, NP   40 mg at 09/11/17 2233  . busPIRone (BUSPAR) tablet 10 mg  10 mg Oral TID Rankin, Shuvon B, NP   10 mg at 09/12/17 4982  . citalopram (CELEXA) tablet 20 mg  20 mg Oral BH-q7a Rankin, Shuvon B, NP   20 mg at 09/12/17 6415  . docusate sodium (COLACE) capsule 100 mg  100 mg Oral Daily PRN Rankin, Shuvon B, NP   100 mg at 09/11/17 2234  . famotidine (PEPCID) tablet 20 mg  20 mg Oral q morning - 10a Rankin, Shuvon B, NP   20 mg at 09/12/17 0834  . hydrOXYzine (ATARAX/VISTARIL) tablet 25 mg  25 mg Oral Q6H PRN Laverle Hobby, PA-C   25 mg at 09/11/17 2234  . Influenza vac split quadrivalent PF (FLUARIX) injection 0.5 mL  0.5 mL Intramuscular Tomorrow-1000 Cobos, Fernando A, MD      . levothyroxine (SYNTHROID, LEVOTHROID) tablet 112 mcg  112 mcg Oral QAC breakfast Rankin, Shuvon B, NP   112 mcg at 09/12/17 8309  . magnesium hydroxide (MILK OF MAGNESIA) suspension 30 mL  30 mL Oral Daily PRN Rankin, Shuvon B, NP      . ondansetron (ZOFRAN) tablet 4 mg  4 mg Oral Q8H PRN Rankin, Shuvon B, NP      . risperiDONE (RISPERDAL) tablet 0.5 mg  0.5 mg Oral QHS Rankin, Shuvon B, NP   0.5 mg  at 09/11/17 2234  . traZODone (DESYREL) tablet 50 mg  50 mg Oral QHS PRN Cobos, Myer Peer, MD   50 mg at 09/11/17 2234    PTA Medications: Prescriptions Prior to Admission  Medication Sig Dispense Refill Last Dose  . acetaminophen (TYLENOL) 325 MG tablet Take 325 mg by mouth every 6 (six) hours as needed (for pain or headaches).   PRN at PRN  . aspirin EC 81 MG tablet Take 81 mg by mouth at bedtime.   09/06/2017 at pm  . atorvastatin (LIPITOR) 40 MG tablet Take 40 mg by mouth at bedtime.   09/06/2017 at am  . busPIRone (BUSPAR) 10 MG tablet Take 10 mg by mouth 3 (three) times daily.   09/07/2017 at am  . citalopram (CELEXA) 20 MG tablet Take 20 mg by mouth every morning.   09/06/2017 at am  . docusate sodium (COLACE) 100 MG capsule Take 100 mg by mouth daily as needed for mild constipation.   PRN at PRN  . famotidine (PEPCID) 20 MG tablet Take 20 mg by mouth every morning.   09/06/2017 at am  . levothyroxine (SYNTHROID, LEVOTHROID) 112 MCG tablet Take 112 mcg by mouth daily before breakfast.  09/07/2017 at am  . risperiDONE (RISPERDAL) 0.5 MG tablet Take 0.5 mg by mouth at bedtime.   09/06/2017 at pm  . vitamin B-12 (CYANOCOBALAMIN) 1000 MCG tablet Take 1,000 mcg by mouth at bedtime.   09/06/2017 at pm    Treatment Modalities: Medication Management, Group therapy, Case management,  1 to 1 session with clinician, Psychoeducation, Recreational therapy.  Patient Stressors: Marital or family conflict Medication change or noncompliance Patient Strengths: Ability for insight Average or above average intelligence Capable of independent living General fund of knowledge Motivation for treatment/growth  Physician Treatment Plan for Primary Diagnosis: MDD, recurrent, no psychotic features  Long Term Goal(s): Improvement in symptoms so as ready for discharge Short Term Goals: Ability to identify changes in lifestyle to reduce recurrence of condition will improve Ability to maintain clinical  measurements within normal limits will improve Ability to verbalize feelings will improve Ability to disclose and discuss suicidal ideas Ability to demonstrate self-control will improve Ability to identify and develop effective coping behaviors will improve Ability to maintain clinical measurements within normal limits will improve Compliance with prescribed medications will improve  Medication Management: Evaluate patient's response, side effects, and tolerance of medication regimen.  Therapeutic Interventions: 1 to 1 sessions, Unit Group sessions and Medication administration.  Evaluation of Outcomes: Progressing  Physician Treatment Plan for Secondary Diagnosis: Active Problems:   Severe recurrent major depression without psychotic features ([place])  Long Term Goal(s): Improvement in symptoms so as ready for discharge  Short Term Goals: Ability to identify changes in lifestyle to reduce recurrence of condition will improve Ability to maintain clinical measurements within normal limits will improve Ability to verbalize feelings will improve Ability to disclose and discuss suicidal ideas Ability to demonstrate self-control will improve Ability to identify and develop effective coping behaviors will improve Ability to maintain clinical measurements within normal limits will improve Compliance with prescribed medications will improve  Medication Management: Evaluate patient's response, side effects, and tolerance of medication regimen.  Therapeutic Interventions: 1 to 1 sessions, Unit Group sessions and Medication administration.  Evaluation of Outcomes: Progressing  RN Treatment Plan for Primary Diagnosis: MDD, recurrent, no psychotic features  Long Term Goal(s): Knowledge of disease and therapeutic regimen to maintain health will improve  Short Term Goals: Ability to remain free from injury will improve, Ability to identify and develop effective coping behaviors will improve and  Compliance with prescribed medications will improve  Medication Management: RN will administer medications as ordered by provider, will assess and evaluate patient's response and provide education to patient for prescribed medication. RN will report any adverse and/or side effects to prescribing provider.  Therapeutic Interventions: 1 on 1 counseling sessions, Psychoeducation, Medication administration, Evaluate responses to treatment, Monitor vital signs and CBGs as ordered, Perform/monitor CIWA, COWS, AIMS and Fall Risk screenings as ordered, Perform wound care treatments as ordered.  Evaluation of Outcomes: Progressing  LCSW Treatment Plan for Primary Diagnosis: MDD, recurrent, no psychotic features  Long Term Goal(s): Safe transition to appropriate next level of care at discharge, Engage patient in therapeutic group addressing interpersonal concerns. Short Term Goals: Engage patient in aftercare planning with referrals and resources, Increase ability to appropriately verbalize feelings, Increase emotional regulation, Identify triggers associated with mental health/substance abuse issues and Increase skills for wellness and recovery  Therapeutic Interventions: Assess for all discharge needs, 1 to 1 time with Social worker, Explore available resources and support systems, Assess for adequacy in community support network, Educate family and significant other(s) on suicide prevention, Complete Psychosocial  Assessment, Interpersonal group therapy.  Evaluation of Outcomes: Not Met  Progress in Treatment: Attending groups: Yes Participating in groups: Yes Taking medication as prescribed: Yes, MD continues to assess for medication changes as needed Toleration medication: Yes, no side effects reported at this time Family/Significant other contact made: No, CSW attempting contact with pt's wife. Patient understands diagnosis: Limited insight. Discussing patient identified problems/goals with staff:  Yes Medical problems stabilized or resolved: Yes Denies suicidal/homicidal ideation: Yes  Issues/concerns per patient self-inventory: None Other: N/A  New problem(s) identified: None identified at this time.   New Short Term/Long Term Goal(s): None identified at this time.   Discharge Plan or Barriers: Pt will return home and follow up with an outpatient provider.  Reason for Continuation of Hospitalization:  Anxiety  Depression Medication stabilization Suicidal ideation  Estimated Length of Stay: 1-3 days; Estimated discharge date 09/15/17  Attendees: Patient: 09/12/2017 10:34 AM  Physician: Dr. Parke Poisson 09/12/2017 10:34 AM  Nursing: Vladimir Faster, RN 09/12/2017 10:34 AM  RN Care Manager: Lars Pinks, RN 09/12/2017 10:34 AM  Social Worker: Matthew Saras, [place] 09/12/2017 10:34 AM  Recreational Therapist:  09/12/2017 10:34 AM  Other: Lindell Spar, NP 09/12/2017 10:34 AM  Other:  09/12/2017 10:34 AM  Other: 09/12/2017 10:34 AM  Scribe for Treatment Team: Georga Kaufmann, MSW,LCSWA 09/12/2017 10:34 AM

## 2017-09-13 NOTE — [facility] Group Notes (Signed)
LCSW Group Therapy Note  Date/Time:  09/13/2017   10:00AM-11:00AM  Type of Therapy and Topic:  Group Therapy:  Fears and Unhealthy/Healthy Coping Skills  Participation Level:  Did Not Attend   Description of Group:  The focus of this group was to discuss some of the prevalent fears that patients experience, and to identify the commonalities among group members.  An exercise was used to initiate the discussion, followed by writing on the white board a group-generated list of unhealthy coping and healthy coping techniques to deal with each fear.    Therapeutic Goals: 1. Patient will be able to distinguish between healthy and unhealthy coping skills 2. Patient will identify and describe 3 fears they experience 3. Patient will identify one positive coping strategy for each fear they experience 4. Patient will respond empathetically to peers' statements regarding fears they experience  Summary of Patient Progress:  The patient expressed N/A  Therapeutic Modalities Cognitive Behavioral Therapy Motivational Interviewing  Ambrose Mantle, LCSW

## 2017-09-13 NOTE — Progress Notes (Signed)
Patient has been up in the dayroom watching tv and interacting with select peers. He attended group this evening and writer informed him of his scheduled medications. He reports that he is ready to get back home and he will take one day at a time as his coping skills for his stress. Patient currently denies having pain, -si/hi/a/v hall. Support and encouragement offered, safety maintained on unit, will continue to monitor.

## 2017-09-13 NOTE — Progress Notes (Signed)
[facility] MD Progress Note  09/13/2017 1:59 PM Devin Joseph  MRN:  [id number]   Subjective:  Patient states that he feels good today. He is denying any SI/HI/AVH and depression and wants to be discharged today.   Objective: Patient's chart and finding reviewed and discussed with treatment team. Patient is seen waiting at the nurses station to tell me he wants to leave today. When asked how he had such a quick turn around after the report of him staying in the bed yesterday and reporting thoughts of wanting to hurt himself and depression. He said the medication. The patient is informed taht he will not be discharged today and he dropped his head and would not continue eye contact. He appeared depressed throughout the interview.   Principal Problem: Severe recurrent major depression without psychotic features (HCC) Diagnosis:   Patient Active Problem List   Diagnosis Date Noted  . Severe recurrent major depression without psychotic features (HCC) [F33.2] 09/10/2017   Total Time spent with patient: 25 minutes  Past Psychiatric History: See H&P  Past Medical History:  Past Medical History:  Diagnosis Date  . Depression   . Suicidal behavior without attempted self-injury   . Thyroid disease     Past Surgical History:  Procedure Laterality Date  . CHOLECYSTECTOMY    . HERNIA REPAIR     Family History: History reviewed. No pertinent family history. Family Psychiatric  History: See H&P Social History:  History  Alcohol Use No     History  Drug Use No    Social History   Social History  . Marital status: Single    Spouse name: N/A  . Number of children: N/A  . Years of education: N/A   Social History Main Topics  . Smoking status: Former Games developer  . Smokeless tobacco: Never Used  . Alcohol use No  . Drug use: No  . Sexual activity: Not Asked   Other Topics Concern  . None   Social History Narrative  . None   Additional Social History:                          Sleep: Good  Appetite:  Good  Current Medications: Current Facility-Administered Medications  Medication Dose Route Frequency Provider Last Rate Last Dose  . acetaminophen (TYLENOL) tablet 650 mg  650 mg Oral Q6H PRN Kerry Hough, PA-C   650 mg at 09/11/17 4098  . alum & mag hydroxide-simeth (MAALOX/MYLANTA) 200-200-20 MG/5ML suspension 30 mL  30 mL Oral Q4H PRN Rankin, Shuvon B, NP      . aspirin EC tablet 81 mg  81 mg Oral QHS Rankin, Shuvon B, NP   81 mg at 09/12/17 2200  . atorvastatin (LIPITOR) tablet 40 mg  40 mg Oral QHS Rankin, Shuvon B, NP   40 mg at 09/12/17 2200  . busPIRone (BUSPAR) tablet 10 mg  10 mg Oral TID Rankin, Shuvon B, NP   10 mg at 09/13/17 1056  . citalopram (CELEXA) tablet 20 mg  20 mg Oral BH-q7a Rankin, Shuvon B, NP   20 mg at 09/13/17 0601  . docusate sodium (COLACE) capsule 100 mg  100 mg Oral Daily PRN Rankin, Shuvon B, NP   100 mg at 09/11/17 2234  . famotidine (PEPCID) tablet 20 mg  20 mg Oral q morning - 10a Rankin, Shuvon B, NP   20 mg at 09/13/17 1056  . hydrOXYzine (ATARAX/VISTARIL) tablet 25 mg  25 mg  Oral Q6H PRN Kerry Hough, PA-C   25 mg at 09/12/17 2200  . Influenza vac split quadrivalent PF (FLUARIX) injection 0.5 mL  0.5 mL Intramuscular Tomorrow-1000 Cobos, Fernando A, MD      . levothyroxine (SYNTHROID, LEVOTHROID) tablet 112 mcg  112 mcg Oral QAC breakfast Rankin, Shuvon B, NP   112 mcg at 09/13/17 0601  . magnesium hydroxide (MILK OF MAGNESIA) suspension 30 mL  30 mL Oral Daily PRN Rankin, Shuvon B, NP      . ondansetron (ZOFRAN) tablet 4 mg  4 mg Oral Q8H PRN Rankin, Shuvon B, NP      . risperiDONE (RISPERDAL) tablet 0.5 mg  0.5 mg Oral QHS Rankin, Shuvon B, NP   0.5 mg at 09/12/17 2200  . traZODone (DESYREL) tablet 50 mg  50 mg Oral QHS PRN Cobos, Rockey Situ, MD   50 mg at 09/12/17 2200    Lab Results:  Results for orders placed or performed during the hospital encounter of 09/10/17 (from the past 48 hour(s))  TSH     Status: None    Collection Time: 09/12/17  6:30 PM  Result Value Ref Range   TSH 3.896 0.350 - 4.500 uIU/mL    Comment: Performed by a 3rd Generation assay with a functional sensitivity of <=0.01 uIU/mL. Performed at [facility], [address], [place], [place] [address]     Blood Alcohol level:  Lab Results  Component Value Date   ETH <10 (H) 09/09/2017    Metabolic Disorder Labs: No results found for: HGBA1C, MPG No results found for: PROLACTIN No results found for: CHOL, TRIG, HDL, CHOLHDL, VLDL, LDLCALC  Physical Findings: AIMS: Facial and Oral Movements Muscles of Facial Expression: None, normal Lips and Perioral Area: None, normal Jaw: None, normal Tongue: None, normal,Extremity Movements Upper (arms, wrists, hands, fingers): None, normal Lower (legs, knees, ankles, toes): None, normal, Trunk Movements Neck, shoulders, hips: None, normal, Overall Severity Severity of abnormal movements (highest score from questions above): None, normal Incapacitation due to abnormal movements: None, normal Patient's awareness of abnormal movements (rate only patient's report): No Awareness, Dental Status Current problems with teeth and/or dentures?: No Does patient usually wear dentures?: No  CIWA:    COWS:     Musculoskeletal: Strength & Muscle Tone: within normal limits Gait & Station: normal Patient leans: N/A  Psychiatric Specialty Exam: Physical Exam  Nursing note and vitals reviewed. Constitutional: He is oriented to person, place, and time. He appears well-developed and well-nourished.  Cardiovascular: Normal rate.   Respiratory: Effort normal.  Musculoskeletal: Normal range of motion.  Neurological: He is alert and oriented to person, place, and time.  Skin: Skin is warm.    Review of Systems  Constitutional: Negative.   HENT: Negative.   Eyes: Negative.   Respiratory: Negative.   Cardiovascular: Negative.   Gastrointestinal: Negative.   Genitourinary:  Negative.   Musculoskeletal: Negative.   Skin: Negative.   Neurological: Negative.   Endo/Heme/Allergies: Negative.     Blood pressure 102/67, pulse 91, temperature 98.6 F (37 C), temperature source Oral, resp. rate 16, height  (1.626 m), weight 85.3 kg (188 lb).Body mass index is 32.27 kg/m.  General Appearance: Casual  Eye Contact:  Good  Speech:  Clear and Coherent and Normal Rate  Volume:  Normal  Mood:  Denies depression, but appears depressed  Affect:  Depressed and Flat  Thought Process:  Goal Directed and Descriptions of Associations: Intact  Orientation:  Full (Time, Place,  and Person)  Thought Content:  WDL  Suicidal Thoughts:  No  Homicidal Thoughts:  No  Memory:  Immediate;   Good Recent;   Good Remote;   Good  Judgement:  Fair  Insight:  Lacking  Psychomotor Activity:  Normal  Concentration:  Concentration: Good and Attention Span: Good  Recall:  Good  Fund of Knowledge:  Good  Language:  Good  Akathisia:  No  Handed:  Right  AIMS (if indicated):     Assets:  Housing Social Support Transportation  ADL's:  Intact  Cognition:  WNL  Sleep:  Number of Hours: 7     Treatment Plan Summary: Daily contact with patient to assess and evaluate symptoms and progress in treatment, Medication management and Plan is to:   -Continue Buspar 10 mg PO TID for anxiety -Contineu Celexa 20 mg PO Daily for mood control -Continue Vistaril 25 mg PO Q6H PRN for anxiety -Continue Risperidone 0.5 mg PO QHS for mood stability -Continue Trazodone 50 mg PO QHS PRN for insomnia -Encourage group therapy participation   Maryfrances Bunnell, FNP 09/13/2017, 1:59 PM

## 2017-09-13 NOTE — [facility] Group Notes (Signed)
Identifying Needs   Date:  09/13/2017  Time:  1100  Type of Therapy:  Nurse Education  /   Identifying Needs :  The group focuses on teaching patients how to identify their needs and then how t0 develop skills needed to get them met.   Participation Level:  Active  Participation Quality:  Attentive  Affect:  Appropriate  Cognitive:  Alert  Insight:  Good  Engagement in Group:  Engaged  Modes of Intervention:  Education  Summary of Progress/Problems:  Lauralyn Primes 09/13/2017, 4:15 PM

## 2017-09-13 NOTE — Progress Notes (Signed)
D Patient remains isolative and to  his room, just like he did yesterday. He did not take his 0800 medications and at 1145, he got up and came to this Clinical research associate and said " I 'm ready for my medications and I want to go home". A HE did complete his daily assessment and on this he wrote he deneid HI today and he rated his depression, hopelessness and anxiety " 1/1/1/", respectively. R He is observed talking to NP about wanting to be dc'd today. Safety is in place.

## 2017-09-14 ENCOUNTER — Encounter (HOSPITAL_COMMUNITY): Payer: Self-pay | Admitting: Registered Nurse

## 2017-09-14 NOTE — [facility] Group Notes (Signed)
[facility] LCSW Group Therapy Note  Date/Time:  09/14/2017 10:00-11:00AM  Type of Therapy and Topic:  Group Therapy:  Healthy and Unhealthy Supports  Participation Level:  Did Not Attend   Description of Group:  Patients in this group were introduced to the idea of adding a variety of healthy supports to address the various needs in their lives. The picture on the front of Sunday's workbook was used to demonstrate why more supports are needed in every patient's life.  Patients identified and described healthy supports versus unhealthy supports in general, then gave examples of each in their own lives.   They discussed what additional healthy supports could be helpful in their recovery and wellness after discharge in order to prevent future hospitalizations.   An emphasis was placed on using counselor, doctor, therapy groups, 12-step groups, and problem-specific support groups to expand supports.  They also worked as a group on developing a specific plan for several patients to deal with unhealthy supports through boundary-setting, psychoeducation with loved ones, and even termination of relationships.   Therapeutic Goals:   1)  discuss importance of adding supports to stay well once out of the hospital  2)  compare healthy versus unhealthy supports and identify some examples of each  3)  generate ideas and descriptions of healthy supports that can be added  4)  offer mutual support about how to address unhealthy supports  5)  encourage active participation in and adherence to discharge plan    Summary of Patient Progress:  N/A   Therapeutic Modalities:   Motivational Interviewing Brief Solution-Focused Therapy  Ambrose Mantle, LCSW 09/14/2017, 1:01 PM

## 2017-09-14 NOTE — Progress Notes (Signed)
Patient denies SI, HI and AVH.  Patient has been present on the unit attending groups and interacting appropriately with peers.   Patient has had no incidents of behavioral dyscontrol.   Assess patient for safety, offer medications as prescribed, engage patient in 1:1 staff talks.   Continue to monitor as planned. Patient able to contract for safety.  

## 2017-09-14 NOTE — Progress Notes (Signed)
Adult Psychoeducational Group Note  Date:  09/14/2017 Time:  2:14 AM  Group Topic/Focus:  Wrap-Up Group:   The focus of this group is to help patients review their daily goal of treatment and discuss progress on daily workbooks.  Participation Level:  Active  Participation Quality:  Appropriate  Affect:  Appropriate  Cognitive:  Appropriate  Insight: Appropriate  Engagement in Group:  Engaged  Modes of Intervention:  Discussion  Additional Comments:  Pt stated his goal for today was to talk with doctor about his discharge plan. Pt stated he accomplished his goal. Pt still rate his day a  5 out of 10. Pt stated he attended all groups held today.  Felipa Furnace 09/14/2017, 2:14 AM

## 2017-09-14 NOTE — Progress Notes (Signed)
Patient has been up and active on the unit, attended group this evening and has voiced no complaints. His wife visited on tonight and he reports that he is ready to go home. Patient currently denies having pain, -si/hi/a/v hall. Support and encouragement offered, safety maintained on unit, will continue to monitor.

## 2017-09-14 NOTE — Progress Notes (Signed)
[facility] MD Progress Note  09/14/2017 3:44 PM Devin Joseph  MRN:  [id number]   Subjective:  Patient states that he feels good.  States that he is eating/sleeping without difficulty; and attending group sessions.  States that he is ready to go home  Objective:  Devin Joseph, 56 y.o., male patient seen by this provider.  Chart reviewed and face to face evaluation on 09/14/17.   On evaluation:  Devin Joseph sitting in bed during interview.  Reports that he usually misses the first group session during the day because he sleeps late.  At this time he denies suicidal/homicidal ideation, psychosis, and paranoia.  Reported that he is tolerating medications without adverse reaction; and eating/sleeping without difficulty.  Patient appears calm/cooperative, alert/oriented x 4 with pleasant affect.  Rates depression 0/10 and anxiety 0/10.     Principal Problem: Severe recurrent major depression without psychotic features (HCC) Diagnosis:   Patient Active Problem List   Diagnosis Date Noted  . Severe recurrent major depression without psychotic features (HCC) [F33.2] 09/10/2017   Total Time spent with patient: 25 minutes  Past Psychiatric History: See H&P  Past Medical History:  Past Medical History:  Diagnosis Date  . Depression   . Suicidal behavior without attempted self-injury   . Thyroid disease     Past Surgical History:  Procedure Laterality Date  . CHOLECYSTECTOMY    . HERNIA REPAIR     Family History: History reviewed. No pertinent family history. Family Psychiatric  History: See H&P Social History:  History  Alcohol Use No     History  Drug Use No    Social History   Social History  . Marital status: Single    Spouse name: N/A  . Number of children: N/A  . Years of education: N/A   Social History Main Topics  . Smoking status: Former Games developer  . Smokeless tobacco: Never Used  . Alcohol use No  . Drug use: No  . Sexual activity: Not Asked   Other Topics  Concern  . None   Social History Narrative  . None   Additional Social History:   Sleep: Good  Appetite:  Good  Current Medications: Current Facility-Administered Medications  Medication Dose Route Frequency Provider Last Rate Last Dose  . acetaminophen (TYLENOL) tablet 650 mg  650 mg Oral Q6H PRN Kerry Hough, PA-C   650 mg at 09/11/17 7846  . alum & mag hydroxide-simeth (MAALOX/MYLANTA) 200-200-20 MG/5ML suspension 30 mL  30 mL Oral Q4H PRN Albie Bazin B, NP      . aspirin EC tablet 81 mg  81 mg Oral QHS Lilyauna Miedema B, NP   81 mg at 09/13/17 2141  . atorvastatin (LIPITOR) tablet 40 mg  40 mg Oral QHS Lucylle Foulkes B, NP   40 mg at 09/13/17 2141  . busPIRone (BUSPAR) tablet 10 mg  10 mg Oral TID Lataunya Ruud B, NP   10 mg at 09/14/17 1009  . citalopram (CELEXA) tablet 20 mg  20 mg Oral BH-q7a Katrisha Segall B, NP   20 mg at 09/14/17 9629  . docusate sodium (COLACE) capsule 100 mg  100 mg Oral Daily PRN Letisia Schwalb B, NP   100 mg at 09/11/17 2234  . famotidine (PEPCID) tablet 20 mg  20 mg Oral q morning - 10a Kalenna Millett B, NP   20 mg at 09/14/17 1009  . hydrOXYzine (ATARAX/VISTARIL) tablet 25 mg  25 mg Oral Q6H PRN Kerry Hough, PA-C  25 mg at 09/12/17 2200  . Influenza vac split quadrivalent PF (FLUARIX) injection 0.5 mL  0.5 mL Intramuscular Tomorrow-1000 Cobos, Fernando A, MD      . levothyroxine (SYNTHROID, LEVOTHROID) tablet 112 mcg  112 mcg Oral QAC breakfast Addilyn Satterwhite B, NP   112 mcg at 09/14/17 1191  . magnesium hydroxide (MILK OF MAGNESIA) suspension 30 mL  30 mL Oral Daily PRN Majed Pellegrin B, NP      . ondansetron (ZOFRAN) tablet 4 mg  4 mg Oral Q8H PRN Molly Savarino B, NP      . risperiDONE (RISPERDAL) tablet 0.5 mg  0.5 mg Oral QHS Achsah Mcquade B, NP   0.5 mg at 09/13/17 2141  . traZODone (DESYREL) tablet 50 mg  50 mg Oral QHS PRN Cobos, Rockey Situ, MD   50 mg at 09/12/17 2200    Lab Results:  Results for orders placed or performed  during the hospital encounter of 09/10/17 (from the past 48 hour(s))  TSH     Status: None   Collection Time: 09/12/17  6:30 PM  Result Value Ref Range   TSH 3.896 0.350 - 4.500 uIU/mL    Comment: Performed by a 3rd Generation assay with a functional sensitivity of <=0.01 uIU/mL. Performed at [facility], [address] [place], [place] [address]     Blood Alcohol level:  Lab Results  Component Value Date   ETH <10 (H) 09/09/2017    Metabolic Disorder Labs: No results found for: HGBA1C, MPG No results found for: PROLACTIN No results found for: CHOL, TRIG, HDL, CHOLHDL, VLDL, LDLCALC  Physical Findings: AIMS: Facial and Oral Movements Muscles of Facial Expression: None, normal Lips and Perioral Area: None, normal Jaw: None, normal Tongue: None, normal,Extremity Movements Upper (arms, wrists, hands, fingers): None, normal Lower (legs, knees, ankles, toes): None, normal, Trunk Movements Neck, shoulders, hips: None, normal, Overall Severity Severity of abnormal movements (highest score from questions above): None, normal Incapacitation due to abnormal movements: None, normal Patient's awareness of abnormal movements (rate only patient's report): No Awareness, Dental Status Current problems with teeth and/or dentures?: No Does patient usually wear dentures?: No  CIWA:    COWS:     Musculoskeletal: Strength & Muscle Tone: within normal limits Gait & Station: normal Patient leans: N/A  Psychiatric Specialty Exam: Physical Exam  Nursing note and vitals reviewed. Constitutional: He is oriented to person, place, and time. He appears well-developed and well-nourished.  Neck: Normal range of motion.  Cardiovascular: Normal rate.   Respiratory: Effort normal.  Musculoskeletal: Normal range of motion.  Neurological: He is alert and oriented to person, place, and time.  Skin: Skin is warm and dry.    Review of Systems  Constitutional: Negative.   HENT:  Negative.   Eyes: Negative.   Respiratory: Negative.   Cardiovascular: Negative.   Gastrointestinal: Negative.   Genitourinary: Negative.   Musculoskeletal: Negative.   Skin: Negative.   Neurological: Negative.   Endo/Heme/Allergies: Negative.   Psychiatric/Behavioral: Positive for depression (Stable). Hallucinations: Denies. Suicidal ideas: Denies. The patient is nervous/anxious (Stable).     Blood pressure 111/76, pulse 91, temperature 98 F (36.7 C), temperature source Oral, resp. rate 18, height  (1.626 m), weight 85.3 kg (188 lb).Body mass index is 32.27 kg/m.  General Appearance: Casual  Eye Contact:  Good  Speech:  Clear and Coherent and Normal Rate  Volume:  Normal  Mood:  "Good"  Affect:  Depressed  Thought Process:  Goal Directed and  Descriptions of Associations: Intact  Orientation:  Full (Time, Place, and Person)  Thought Content:  WDL and Denies hallucinations, delusions, and paranoia  Suicidal Thoughts:  No  Homicidal Thoughts:  No  Memory:  Immediate;   Good Recent;   Good Remote;   Good  Judgement:  Fair  Insight:  Fair  Psychomotor Activity:  Normal  Concentration:  Concentration: Good and Attention Span: Good  Recall:  Good  Fund of Knowledge:  Good  Language:  Good  Akathisia:  No  Handed:  Right  AIMS (if indicated):     Assets:  Housing Social Support Transportation  ADL's:  Intact  Cognition:  WNL  Sleep:  Number of Hours: 5.75     Treatment Plan Summary: Daily contact with patient to assess and evaluate symptoms and progress in treatment, Medication management and Plan is to:   -Continue Buspar 10 mg PO TID for anxiety -Continue Celexa 20 mg PO Daily for mood control -Continue Vistaril 25 mg PO Q6H PRN for anxiety -Continue Risperidone 0.5 mg PO QHS for mood stability -Continue Trazodone 50 mg PO QHS PRN for insomnia -Encourage group therapy participation  Continue current treatment plan; no changes at this time   Vitor Overbaugh,  Denice Bors, NP 09/14/2017, 3:44 PM

## 2017-09-15 MED ORDER — LEVOTHYROXINE SODIUM 112 MCG PO TABS: 112.0000 ug | ORAL_TABLET | Freq: Every day | ORAL | Status: AC

## 2017-09-15 MED ORDER — ACETAMINOPHEN 325 MG PO TABS: 325.0000 mg | ORAL_TABLET | Freq: Four times a day (QID) | ORAL | Status: DC | PRN

## 2017-09-15 MED ORDER — BUSPIRONE HCL 10 MG PO TABS: 10.0000 mg | ORAL_TABLET | Freq: Three times a day (TID) | ORAL | 0 refills | Status: DC

## 2017-09-15 MED ORDER — DOCUSATE SODIUM 100 MG PO CAPS
100.0000 mg | ORAL_CAPSULE | Freq: Every day | ORAL | 0 refills | Status: DC | PRN
Start: 2017-09-15 — End: 2017-12-01

## 2017-09-15 MED ORDER — TRAZODONE HCL 50 MG PO TABS: 50.0000 mg | ORAL_TABLET | Freq: Every evening | ORAL | 0 refills | Status: DC | PRN

## 2017-09-15 MED ORDER — CITALOPRAM HYDROBROMIDE 20 MG PO TABS: 20.0000 mg | ORAL_TABLET | ORAL | 0 refills | Status: DC

## 2017-09-15 MED ORDER — ATORVASTATIN CALCIUM 40 MG PO TABS: 40.0000 mg | ORAL_TABLET | Freq: Every day | ORAL | Status: DC

## 2017-09-15 MED ORDER — ASPIRIN EC 81 MG PO TBEC: 81.0000 mg | DELAYED_RELEASE_TABLET | Freq: Every day | ORAL | Status: DC

## 2017-09-15 MED ORDER — FAMOTIDINE 20 MG PO TABS: 20.0000 mg | ORAL_TABLET | Freq: Every morning | ORAL | Status: DC

## 2017-09-15 MED ORDER — HYDROXYZINE HCL 25 MG PO TABS: 25.0000 mg | ORAL_TABLET | Freq: Four times a day (QID) | ORAL | 0 refills | Status: DC | PRN

## 2017-09-15 MED ORDER — RISPERIDONE 0.5 MG PO TABS: 0.5000 mg | ORAL_TABLET | Freq: Every day | ORAL | 0 refills | Status: DC

## 2017-09-15 NOTE — Progress Notes (Addendum)
  [facility] Adult Case Management Discharge Plan :  Will you be returning to the same living situation after discharge:  Yes,  home w wife At discharge, do you have transportation home?: Yes,  wife Do you have the ability to pay for your medications: Yes,  insured, no concerns expressed  Release of information consent forms completed and in the chart;  Patient's signature needed at discharge.  Patient to Follow up at: Follow-up Information    [facility] Follow up.   Why:  Medications management w Barbette Merino NP Oct 17 at 2:40 PM.  You have also been placed on a cancellation list for an earlier appointment.  Please ask to see a therapist if desired. Contact information:  [address] [place], [place] [address] P: [phone] F:  [phone]       [facility] Follow up on 09/18/2017.   Why:  Hospital discharge follow up appointment on 10/4 at 9 AM.  Bring insurance card, social security card, photo ID, household income. You will be assessed for therapy and any groups available.  Call to cancel/reschedule if needed.  Contact information: [address] [place] [address] [phone]           Next level of care provider has access to [facility] Link:no  Safety Planning and Suicide Prevention discussed: Yes,  w wife Lupita Leash  Have you used any form of tobacco in the last 30 days? (Cigarettes, Smokeless Tobacco, Cigars, and/or Pipes): No  Has patient been referred to the Quitline?: N/A patient is not a smoker  Patient has been referred for addiction treatment: Yes  Sallee Lange, LCSW 09/15/2017, 5:03 PM
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
## 2017-09-15 NOTE — Progress Notes (Signed)
Patient ID: Devin Joseph, male   DOB: September 29, 1961, 56 y.o.   MRN: [id number]  DAR: Pt. Denies SI/HI and A/V Hallucinations. He reports that his sleep last night was fair, appetite is fair, energy level is low, and concentration is poor. He rates his depression, hopelessness, and anxiety level 0/10. Patient does not report any pain or discomfort at this time. Support and encouragement provided to the patient. Scheduled medication administered to patient per physician's orders. Patient is minimal with Clinical research associate but cooperative. He is seen in the milieu intermittently. He reports that he is ready for discharge. Q15 minute checks are maintained for safety.

## 2017-09-15 NOTE — BHH Suicide Risk Assessment (Signed)
[facility] Discharge Suicide Risk Assessment   Principal Problem: Severe recurrent major depression without psychotic features [facility]) Discharge Diagnoses:  Patient Active Problem List   Diagnosis Date Noted  . Severe recurrent major depression without psychotic features (HCC) [F33.2] 09/10/2017    Total Time spent with patient: 30 minutes  Musculoskeletal: Strength & Muscle Tone: within normal limits Gait & Station: normal Patient leans: N/A  Psychiatric Specialty Exam: ROS denies headache, no chest pain, no shortness of breath, no vomiting   Blood pressure 107/76, pulse 84, temperature 97.9 F (36.6 C), temperature source Oral, resp. rate 18, height  (1.626 m), weight 85.3 kg (188 lb).Body mass index is 32.27 kg/m.  General Appearance: improved mood   Eye Contact::  Good  Speech:  Normal Rate409  Volume:  Normal  Mood:  improvin mood , denies feeling depressed, presents euthymic at this time   Affect:  Appropriate and Full Range  Thought Process:  Linear and Descriptions of Associations: Intact  Orientation:  Full (Time, Place, and Person)  Thought Content:  denies hallucinations, no delusions,not internally preoccupied   Suicidal Thoughts:  No denies suicidal or self injurious ideations, denies any homicidal or violent ideations  Homicidal Thoughts:  No  Memory:  recent and remote grossly intact   Judgement:  Other:  improving   Insight:  improving   Psychomotor Activity:  Normal  Concentration:  Good  Recall:  Good  Fund of Knowledge:Good  Language: Good  Akathisia:  Negative  Handed:  Right  AIMS (if indicated):     Assets:  Communication Skills Desire for Improvement Resilience  Sleep:  Number of Hours: 6  Cognition: WNL  ADL's:  Intact   Mental Status Per Nursing Assessment::   On Admission:     Demographic Factors:  56 year old married male , on disability, lives with wife   Loss Factors: Disability  Historical Factors: History of depression, history  of prior psychiatric admissions, denies prior suicidal attempts, reported  history of catatonic episodes during prior decompensations   Risk Reduction Factors:   Sense of responsibility to family, Living with another person, especially a relative, Positive social support and Positive coping skills or problem solving skills  Continued Clinical Symptoms:  At this time patient is alert, attentive, good eye contact, calm, pleasant on approach, mood is improved, denies feeling depressed and presents euthymic, with a full range of affect, no thought disorder, no suicidal or self injurious ideations, denies any homicidal or violent ideations, no psychotic symptoms , not internally preoccupied, no delusions expressed,future oriented . At this time denies medication side effects. With patient's express consent I have spoken with his wife via phone- she corroborates he presents with improvement and is agreeing to him being discharged today .  Cognitive Features That Contribute To Risk:  No gross cognitive deficits noted upon discharge. Is alert , attentive, and oriented x 3    Suicide Risk:  Mild:  Suicidal ideation of limited frequency, intensity, duration, and specificity.  There are no identifiable plans, no associated intent, mild dysphoria and related symptoms, good self-control (both objective and subjective assessment), few other risk factors, and identifiable protective factors, including available and accessible social support.  Follow-up Information    [facility] Follow up.   Why:  Medications management w Barbette Merino NP Oct 17 at 2:40 PM.  You have also been placed on a cancellation list for an earlier appointment.  Please ask to see a therapist if desired.  Contact information:  [address] [place], [place] [address] P: [phone] F:  [phone]       [facility] Follow up.   Why:  Please use walk in hours (Mon -  Fri 8:30 - 3 PM) for any mental health needs prior to your scheduled appointment at your regular provider. Contact information: [address] [place] [address] [phone]           Plan Of Care/Follow-up recommendations:  Activity:  as tolerated  Diet:  Regular Tests:  NA Other:  See below Patient is requesting discharge, expressing readiness for discharge , there are no current grounds for involuntary commitment  He is leaving unit in good spirits Plans to return home Follow up as above  Has an established PCP for medical management- Dr. Eliberto Ivory at [facility], MD 09/15/2017, 12:40 PM

## 2017-09-15 NOTE — Progress Notes (Signed)
Adult Psychoeducational Group Note  Date:  09/15/2017 Time:  2:09 AM  Group Topic/Focus:  Wrap-Up Group:   The focus of this group is to help patients review their daily goal of treatment and discuss progress on daily workbooks.  Participation Level:  Active  Participation Quality:  Appropriate  Affect:  Appropriate  Cognitive:  Appropriate  Insight: Appropriate  Engagement in Group:  Engaged  Modes of Intervention:  Discussion  Additional Comments:  Pt stated his goal for today was to talk with his doctor about his discharge plan. Pt stated he was able to achieved his goal today. Pt rated his day a 10. Pt attended all groups today.  Felipa Furnace 09/15/2017, 2:09 AM

## 2017-09-15 NOTE — Progress Notes (Signed)
Recreation Therapy Notes  Date: 09/15/17 Time: 0930 Location: [address] Dayroom  Group Topic: Stress Management  Goal Area(s) Addresses:  Patient will verbalize importance of using healthy stress management.  Patient will identify positive emotions associated with healthy stress management.   Intervention: Stress Management  Activity :  LRT introduced the stress management technique of guided imagery.  LRT read Joseph script that allowed patients to take Joseph mental vacation to the beach.  Patients were to follow along as LRT read script to fully engage in the technique.  Education:  Stress Management, Discharge Planning.   Education Outcome: Acknowledges edcuation/In group clarification offered/Needs additional education  Clinical Observations/Feedback: Pt did not attend group.   Devin Joseph, LRT/CTRS         Devin Joseph 09/15/2017 1:36 PM 

## 2017-09-15 NOTE — BHH Suicide Risk Assessment (Signed)
B[facility] INPATIENT:  Family/Significant Other Suicide Prevention Education  Suicide Prevention Education:  Education Completed; Devin Joseph, wife, [phone],  (name of family member/significant other) has been identified by the patient as the family member/significant other with whom the patient will be residing, and identified as the person(s) who will aid the patient in the event of a mental health crisis (suicidal ideations/suicide attempt).  With written consent from the patient, the family member/significant other has been provided the following suicide prevention education, prior to the and/or following the discharge of the patient.  The suicide prevention education provided includes the following:  Suicide risk factors  Suicide prevention and interventions  National Suicide Hotline telephone number  [facility] assessment telephone number  [facility] Emergency Assistance 911  [facility] and/or Residential Mobile Crisis Unit telephone number  Request made of family/significant other to:  Remove weapons (e.g., guns, rifles, knives), all items previously/currently identified as safety concern.    Remove drugs/medications (over-the-counter, prescriptions, illicit drugs), all items previously/currently identified as a safety concern.  The family member/significant other verbalizes understanding of the suicide prevention education information provided.  The family member/significant other agrees to remove the items of safety concern listed above.  Wife has spoken w patient, "he's better than he was last week" AEB Per wife, patient was assessed at [facility] and discharged prior to admission to [facility].  Per wife, patient was "still upset" and "didnt call w his code until Thursday".  At that time, was "more calm but not completely, over weekend, was better."  Wife is comfortable w discharge today, appears to be "smiling more and be more at ease, not so easily upset."   "Each time there's another incident of this, it seems like it gets more involved."  Per wife, patient has inadequate coping skills for dealing w normal upsets of life.  Was seeing therapist approx one year ago - per patient, did not return because he felt it was not effective.  No access to guns in the home; wife has not secured medications.  Advised on medication safety in the home.  Wife notes that patient appears angry, irritable, under stress prior to making suicidal gestures/statements in past.  Per wife, patient has been inconsistent in taking medications - current episode of agitation was precipitated by patient refusing to take citalopram in the morning.  CSW encouraged wife to consider bringing patient to [facility] for assessment to discuss resources.  Wife would like patient to participate in day treatment program.  CSW will provide resources for senior and disabled programs.     Sallee Lange 09/15/2017, 9:53 AM

## 2017-10-23 ENCOUNTER — Encounter (HOSPITAL_COMMUNITY): Payer: Self-pay

## 2017-10-23 ENCOUNTER — Emergency Department (HOSPITAL_COMMUNITY)
Admission: EM | Admit: 2017-10-23 | Discharge: 2017-10-24 | Disposition: A | Discharge status: Home / Self Care | Payer: Medicare HMO | Attending: Emergency Medicine | Admitting: Emergency Medicine

## 2017-10-23 ENCOUNTER — Other Ambulatory Visit: Payer: Self-pay

## 2017-10-23 DIAGNOSIS — Z79899 Other long term (current) drug therapy: Secondary | ICD-10-CM | POA: Diagnosis not present

## 2017-10-23 DIAGNOSIS — F329 Major depressive disorder, single episode, unspecified: Secondary | ICD-10-CM | POA: Insufficient documentation

## 2017-10-23 DIAGNOSIS — Z87891 Personal history of nicotine dependence: Secondary | ICD-10-CM | POA: Insufficient documentation

## 2017-10-23 DIAGNOSIS — Z7982 Long term (current) use of aspirin: Secondary | ICD-10-CM | POA: Diagnosis not present

## 2017-10-23 DIAGNOSIS — R45851 Suicidal ideations: Secondary | ICD-10-CM | POA: Insufficient documentation

## 2017-10-23 DIAGNOSIS — Z91040 Latex allergy status: Secondary | ICD-10-CM | POA: Diagnosis not present

## 2017-10-23 DIAGNOSIS — E039 Hypothyroidism, unspecified: Secondary | ICD-10-CM | POA: Insufficient documentation

## 2017-10-23 LAB — COMPREHENSIVE METABOLIC PANEL
ALBUMIN: 3.9 g/dL (ref 3.5–5.0)
ALT: 27 U/L (ref 17–63)
AST: 22 U/L (ref 15–41)
Alkaline Phosphatase: 109 U/L (ref 38–126)
Anion gap: 7 (ref 5–15)
BILIRUBIN TOTAL: 0.7 mg/dL (ref 0.3–1.2)
BUN: 11 mg/dL (ref 6–20)
CHLORIDE: 106 mmol/L (ref 101–111)
CO2: 27 mmol/L (ref 22–32)
Calcium: 8.6 mg/dL — ABNORMAL LOW (ref 8.9–10.3)
Creatinine, Ser: 1.51 mg/dL — ABNORMAL HIGH (ref 0.61–1.24)
GFR calc Af Amer: 59 mL/min — ABNORMAL LOW (ref >60)
GFR calc non Af Amer: 51 mL/min — ABNORMAL LOW (ref >60)
GLUCOSE: 114 mg/dL — AB (ref 65–99)
POTASSIUM: 3.8 mmol/L (ref 3.5–5.1)
SODIUM: 140 mmol/L (ref 135–145)
Total Protein: 6.5 g/dL (ref 6.5–8.1)

## 2017-10-23 LAB — CBC
HEMATOCRIT: 42 % (ref 39.0–52.0)
HEMOGLOBIN: 13.8 g/dL (ref 13.0–17.0)
MCH: 29.1 pg (ref 26.0–34.0)
MCHC: 32.9 g/dL (ref 30.0–36.0)
MCV: 88.6 fL (ref 78.0–100.0)
Platelets: 146 10*3/uL — ABNORMAL LOW (ref 150–400)
RBC: 4.74 MIL/uL (ref 4.22–5.81)
RDW: 13.7 % (ref 11.5–15.5)
WBC: 7.5 10*3/uL (ref 4.0–10.5)

## 2017-10-23 LAB — SALICYLATE LEVEL: Salicylate Lvl: <7 mg/dL (ref 2.8–30.0)

## 2017-10-23 LAB — RAPID URINE DRUG SCREEN, HOSP PERFORMED
AMPHETAMINES: NOT DETECTED
BARBITURATES: NOT DETECTED
BENZODIAZEPINES: NOT DETECTED
COCAINE: NOT DETECTED
Opiates: NOT DETECTED
Tetrahydrocannabinol: NOT DETECTED

## 2017-10-23 LAB — ETHANOL: Alcohol, Ethyl (B): <10 mg/dL (ref <10)

## 2017-10-23 LAB — ACETAMINOPHEN LEVEL

## 2017-10-23 NOTE — ED Notes (Signed)
Donna SherriLoura Pardonll pt wife cell phone [phone]

## 2017-10-23 NOTE — ED Triage Notes (Signed)
Pt states that he began to feel SI today with a plan to kill himself tomorrow while his mother is sleeping by cutting himself, denies HI/AVH. Pt reports compliance with meds but feels they are not working.

## 2017-10-24 ENCOUNTER — Encounter (HOSPITAL_COMMUNITY): Payer: Self-pay | Admitting: Registered Nurse

## 2017-10-24 MED ORDER — BUSPIRONE HCL 10 MG PO TABS
10.0000 mg | ORAL_TABLET | Freq: Three times a day (TID) | ORAL | Status: DC
  Administered 2017-10-24: 10 mg via ORAL
  Filled 2017-10-24: qty 1

## 2017-10-24 MED ORDER — LEVOTHYROXINE SODIUM 112 MCG PO TABS
112.0000 ug | ORAL_TABLET | Freq: Every day | ORAL | Status: DC
  Filled 2017-10-24: qty 1

## 2017-10-24 MED ORDER — ATORVASTATIN CALCIUM 40 MG PO TABS: 40.0000 mg | ORAL_TABLET | Freq: Every day | ORAL | Status: DC

## 2017-10-24 MED ORDER — CITALOPRAM HYDROBROMIDE 10 MG PO TABS
20.0000 mg | ORAL_TABLET | ORAL | Status: DC
  Administered 2017-10-24: 20 mg via ORAL
  Filled 2017-10-24: qty 2

## 2017-10-24 MED ORDER — TRAZODONE HCL 50 MG PO TABS: 50.0000 mg | ORAL_TABLET | Freq: Every evening | ORAL | Status: DC | PRN

## 2017-10-24 MED ORDER — ASPIRIN EC 81 MG PO TBEC: 81.0000 mg | DELAYED_RELEASE_TABLET | Freq: Every day | ORAL | Status: DC

## 2017-10-24 MED ORDER — RISPERIDONE 0.5 MG PO TABS: 0.5000 mg | ORAL_TABLET | Freq: Every day | ORAL | Status: DC

## 2017-10-24 MED ORDER — RISPERIDONE 0.5 MG PO TABS: 0.5000 mg | ORAL_TABLET | Freq: Every day | ORAL | 0 refills | Status: DC

## 2017-10-24 MED ORDER — CITALOPRAM HYDROBROMIDE 20 MG PO TABS: 20.0000 mg | ORAL_TABLET | Freq: Every day | ORAL | 0 refills | Status: DC

## 2017-10-24 MED ORDER — DOCUSATE SODIUM 100 MG PO CAPS: 100.0000 mg | ORAL_CAPSULE | Freq: Every day | ORAL | Status: DC | PRN

## 2017-10-24 MED ORDER — ACETAMINOPHEN 325 MG PO TABS: 325.0000 mg | ORAL_TABLET | Freq: Four times a day (QID) | ORAL | Status: DC | PRN

## 2017-10-24 MED ORDER — BUSPIRONE HCL 10 MG PO TABS: 10.0000 mg | ORAL_TABLET | Freq: Three times a day (TID) | ORAL | 0 refills | Status: AC

## 2017-10-24 MED ORDER — FAMOTIDINE 20 MG PO TABS
20.0000 mg | ORAL_TABLET | Freq: Every morning | ORAL | Status: DC
  Administered 2017-10-24: 20 mg via ORAL
  Filled 2017-10-24: qty 1

## 2017-10-24 MED ORDER — HYDROXYZINE HCL 25 MG PO TABS
25.0000 mg | ORAL_TABLET | Freq: Four times a day (QID) | ORAL | Status: DC | PRN
Start: 2017-10-24 — End: 2017-10-24

## 2017-10-24 NOTE — ED Notes (Signed)
Per Psychiatry Rankin NP at bedside, EDP to give patient 2 weeks prescriptions for Celexa, Rispredal and Buspar at discharge and patient to follow up [facility], keep therapy appt.

## 2017-10-24 NOTE — BHH Counselor (Signed)
Per Nira ConnJason Berry, NP: Recommendation for safety and stabilization, due Patient's suicidal ideation with Patient's reported absence of depressive symptoms.  Re-evaluation by psychiatry in the AM.   [facility] Attending Provider, Bebe ShaggyWickline, MD notified at [phone].

## 2017-10-24 NOTE — ED Provider Notes (Signed)
10:54 AM Patient seen and evaluated by the psychiatric team and deemed stable for discharge from a mental health standpoint.  From a medical standpoint the patient is please see consultation note for complete details.  I have not personally evaluated this patient.   Azalia Bilisampos, Adaliz Dobis, MD 10/24/17 1054

## 2017-10-24 NOTE — BH Assessment (Signed)
Tele Assessment Note   Patient Name: Devin Joseph MRN: [id number] Referring Physician: Rochele Raring, DO Location of Patient: [facility] Location of Provider: Behavioral Health TTS Department  Devin Joseph is an 56 y.o.married male, who was voluntarily brought into [facility], by his wife.  Patient reported being involved in an argument with his wife due to the results of a football game he watched.  Per EDP note (Ward, DO, 10/24/2017), "States that he got upset after the results of a football game and got into a fight with his mother." Patient reported having suicidal ideations, with a plan to use a knife to "cut myself open."  Patient reported having access to knives.  Patient reported having "several" previous suicide attempts.  Patient denies any depressive symptoms.  Patient denies homicidal ideations, auditory/visual hallucinations, self-injurious behaviors, or substance use.    Patient reported currently receiving disability benefits and residing with his wife and mother.  Patient identified recent stressors associated with the conflict relating to the results of the football game.  Patient reported no arrests, probation/parole, or upcoming court dates.  Patient denies any history of physical, sexual, or verbal abuse.   Patient reported no family history of suicide and substance abuse.  Patient stated receiving inpatient treatment for depression and suicidal ideations at [facility] and [facility] in 2018.  Patient reported currently receiving outpatient treatment with Dr. Ian Malkin and Dr. Lang Snow.    During assessment, Patient was calm and cooperative.  Patient was dressed in scrubs and laying in his bed.  Patient was oriented to person, time, location, and situation. Patient's eye contact was fair.  Patient's motor activity consisted of freedom of movement.  Patient's speech was logical and coherent.  Patient's level of consciousness was alert.  Patient's mood appeared  to be pleasant.  Patient's affect was flat.  Patient's thought process was coherent and relevant.  Patient's judgment appeared to be unimpaired.  Diagnosis: Major depressive disorder, recurrent, severe, (Per medical history)  Past Medical History:  Past Medical History:  Diagnosis Date  . Depression   . Suicidal behavior without attempted self-injury   . Thyroid disease     Past Surgical History:  Procedure Laterality Date  . CHOLECYSTECTOMY    . HERNIA REPAIR      Family History: No family history on file.  Social History:  reports that he has quit smoking. he has never used smokeless tobacco. He reports that he does not drink alcohol or use drugs.  Additional Social History:  Alcohol / Drug Use Pain Medications: See MAR Prescriptions: See MAR Over the Counter: See MAR History of alcohol / drug use?: No history of alcohol / drug abuse Longest period of sobriety (when/how long): N/A  CIWA: CIWA-Ar BP: 138/88 Pulse Rate: 74 COWS:    PATIENT STRENGTHS: (choose at least two) Ability for insight Average or above average intelligence Communication skills Financial means General fund of knowledge Motivation for treatment/growth  Allergies:  Allergies  Allergen Reactions  . Latex Rash    NO POWDERED GLOVES, please!!    Home Medications:  (Not in a hospital admission)  OB/GYN Status:  No LMP for male patient.  General Assessment Data Location of Assessment: [facility] ED TTS Assessment: In system Is this a Tele or Face-to-Face Assessment?: Tele Assessment Is this an Initial Assessment or a Re-assessment for this encounter?: Initial Assessment Marital status: Married Is patient pregnant?: No Pregnancy Status: No Living Arrangements: Spouse/significant other, Parent Can pt return to current  living arrangement?: Yes Admission Status: Voluntary Is patient capable of signing voluntary admission?: Yes Referral Source: Self/Family/Friend Insurance type: Medicare      Crisis Care Plan Living Arrangements: Spouse/significant other, Parent Legal Guardian: Other:(Self) Name of Psychiatrist: dr Jerald Kieffarah, mcdonald np Name of Therapist: dr Toya Smothersellen nicola  Education Status Is patient currently in school?: No Current Grade: N/A Highest grade of school patient has completed: 12th Name of school: N/A Contact person: N/A  Risk to self with the past 6 months Suicidal Ideation: Yes-Currently Present Has patient been a risk to self within the past 6 months prior to admission? : Yes Suicidal Intent: Yes-Currently Present Has patient had any suicidal intent within the past 6 months prior to admission? : Yes Is patient at risk for suicide?: Yes Suicidal Plan?: Yes-Currently Present Has patient had any suicidal plan within the past 6 months prior to admission? : Yes Specify Current Suicidal Plan: Patient plan to "cut myself open" with a knife Access to Means: Yes Specify Access to Suicidal Means: Patient reported having acces to knives What has been your use of drugs/alcohol within the last 12 months?: None reported Previous Attempts/Gestures: Yes How many times?: (Pt. reported "several") Other Self Harm Risks: Patient denies. Triggers for Past Attempts: Unpredictable Intentional Self Injurious Behavior: None Family Suicide History: No Recent stressful life event(s): Conflict (Comment)(Pt reported conflict with wife.) Persecutory voices/beliefs?: No Depression: No Substance abuse history and/or treatment for substance abuse?: No Suicide prevention information given to non-admitted patients: Not applicable  Risk to Others within the past 6 months Homicidal Ideation: No(Patient denies.) Does patient have any lifetime risk of violence toward others beyond the six months prior to admission? : No(Patient denies.) Thoughts of Harm to Others: No(Patient denies.) Current Homicidal Intent: No(Patient denies.) Current Homicidal Plan: No(Patient denies.) Access to  Homicidal Means: No(Patient denies.) Identified Victim: Patient denies. History of harm to others?: No Assessment of Violence: None Noted Violent Behavior Description: None noted Does patient have access to weapons?: No Criminal Charges Pending?: No Does patient have a court date: No Is patient on probation?: No  Psychosis Hallucinations: None noted Delusions: None noted  Mental Status Report Appearance/Hygiene: In scrubs, Unremarkable Eye Contact: Fair Motor Activity: Freedom of movement Speech: Logical/coherent Level of Consciousness: Alert Mood: Pleasant Affect: Flat Anxiety Level: None Thought Processes: Coherent, Relevant Judgement: Unimpaired Orientation: Person, Place, Time, Situation Obsessive Compulsive Thoughts/Behaviors: None  Cognitive Functioning Concentration: Good Memory: Recent Intact, Remote Intact IQ: Average Insight: Fair Impulse Control: Poor Appetite: Good Weight Loss: 0 Weight Gain: 0 Sleep: No Change Total Hours of Sleep: 6 Vegetative Symptoms: None  ADLScreening [facility]) Patient's cognitive ability adequate to safely complete daily activities?: Yes Patient able to express need for assistance with ADLs?: Yes Independently performs ADLs?: Yes (appropriate for developmental age)  Prior Inpatient Therapy Prior Inpatient Therapy: Yes Prior Therapy Dates: 2018 [facility] Reason for Treatment: SI, MDD  Prior Outpatient Therapy Prior Outpatient Therapy: Yes Prior Therapy Dates: currently Prior Therapy Facilty/Provider(s): Jeannine KittenFarah, mcdonald, nicola Reason for Treatment: med management, talk therapy Does patient have an ACCT team?: No Does patient have Intensive In-House Services?  : No Does patient have [facility] services? : No Does patient have P4CC services?: No  ADL Screening (condition at time of admission) Patient's cognitive ability adequate to safely complete daily  activities?: Yes Is the patient deaf or have difficulty hearing?: No Does the patient have difficulty seeing, even when wearing glasses/contacts?: No Does the patient have  difficulty concentrating, remembering, or making decisions?: No Patient able to express need for assistance with ADLs?: Yes Does the patient have difficulty dressing or bathing?: No Independently performs ADLs?: Yes (appropriate for developmental age) Does the patient have difficulty walking or climbing stairs?: No Weakness of Legs: None Weakness of Arms/Hands: None  Home Assistive Devices/Equipment Home Assistive Devices/Equipment: None    Abuse/Neglect Assessment (Assessment to be complete while patient is alone) Abuse/Neglect Assessment Can Be Completed: Yes Physical Abuse: Denies Verbal Abuse: Denies Sexual Abuse: Denies Exploitation of patient/patient's resources: Denies Self-Neglect: Denies     Merchant navy officerAdvance Directives (For Healthcare) Does Patient Have a Medical Advance Directive?: No Would patient like information on creating a medical advance directive?: No - Patient declined    Additional Information 1:1 In Past 12 Months?: No CIRT Risk: No Elopement Risk: No Does patient have medical clearance?: Yes     Disposition:  Disposition Initial Assessment Completed for this Encounter: Yes Disposition of Patient: Other dispositions, Re-evaluation by Psychiatry recommended(Per Nira ConnJason Berry, NP) Other disposition(s): Other (Comment)(Continual observation for safety and stabilization)  This service was provided via telemedicine using a 2-way, interactive audio and video technology.   Devin Joseph 10/24/2017 5:40 AM

## 2017-10-24 NOTE — Progress Notes (Signed)
Per Assunta FoundShuvon Rankin, NP, the patient does not meet criteria for inpatient treatment. The patient is recommended for discharge and to follow up with [facility] for medication management and to continue to follow up with his therapist.   The patient is psychiatrically cleared at this time.   CSW attempted to contact [facility] for a possible hospital discharge appointment, however their voicemail states they are currently closed.   Per Shuvon Rankin, NP, the patient will be given 2 weeks worth of his medications until he can follow up with [facility] for medication management. CSW attempted to call the patient's wife to inform her that the patient needed to follow up with [facility] on Monday to be connected with their outpatient services, however there was no answer.   Charlton AmorNicole Kohut, RN aware per chart.    Baldo DaubJolan Skyeler Smola MSW, LCSWA CSW Disposition [phone]

## 2017-10-24 NOTE — Consult Note (Signed)
Devin Joseph, 56 y.o., [facility] of suicidal ideation after getting into an argument with his wife and mother about calls that were made during football game.  Patient states that he is feeling better today.  Reports that he is compliant with his medications and that his wife helps him with his medications and appointments.  States that he has psychiatry and therapy outpatient services.  At this time patient denies suicidal/homicidal ideation, psychosis, and paranoia.  Patient is able to contract for safety.       Patient cleared psychiatrically  Recommendation:  Keep scheduled appointment with therapist.  Call current psychiatrist to schedule sooner appointment.  Continue current psychotropics.     Vaughn Frieze B. Jamear Carbonneau, NP

## 2017-10-24 NOTE — ED Notes (Signed)
Sitter had to leave for unknown reason.  Patient is being observed by ED staff at this time

## 2017-10-24 NOTE — ED Provider Notes (Signed)
TIME SEEN: 12:05 AM  CHIEF COMPLAINT: Suicidal  HPI: Patient is a 56 year old male with history of depression and previous suicidal thoughts who presents to the emergency department today with suicidal thoughts tonight.  States that he got upset after the results of a football game and got into a fight with his mother.  He states "I am just tired of it".  He has a plan to cut his wrist.  He cannot contract for safety.  Denies HI, hallucinations.  Reports compliance with his medications.  Denies drug or alcohol use.  Denies new medical complaints including pain, fever, cough, vomiting, diarrhea.  ROS: See HPI Constitutional: no fever  Eyes: no drainage  ENT: no runny nose   Cardiovascular:  no chest pain  Resp: no SOB  GI: no vomiting GU: no dysuria Integumentary: no rash  Allergy: no hives  Musculoskeletal: no leg swelling  Neurological: no slurred speech ROS otherwise negative  PAST MEDICAL HISTORY/PAST SURGICAL HISTORY:  Past Medical History:  Diagnosis Date  . Depression   . Suicidal behavior without attempted self-injury   . Thyroid disease     MEDICATIONS:  Prior to Admission medications   Medication Sig Start Date End Date Taking? Authorizing Provider  acetaminophen (TYLENOL) 325 MG tablet Take 1 tablet (325 mg total) by mouth every 6 (six) hours as needed (for pain or headaches). 09/15/17   Armandina StammerNwoko, Agnes I, NP  aspirin EC 81 MG tablet Take 1 tablet (81 mg total) by mouth at bedtime. For heart health 09/15/17   Armandina StammerNwoko, Agnes I, NP  atorvastatin (LIPITOR) 40 MG tablet Take 1 tablet (40 mg total) by mouth at bedtime. For high Cholesterol 09/15/17   Armandina StammerNwoko, Agnes I, NP  busPIRone (BUSPAR) 10 MG tablet Take 1 tablet (10 mg total) by mouth 3 (three) times daily. For anxiety 09/15/17   Armandina StammerNwoko, Agnes I, NP  citalopram (CELEXA) 20 MG tablet Take 1 tablet (20 mg total) by mouth every morning. For depression 09/16/17   Armandina StammerNwoko, Agnes I, NP  docusate sodium (COLACE) 100 MG capsule Take 1 capsule  (100 mg total) by mouth daily as needed for mild constipation. 09/15/17   Armandina StammerNwoko, Agnes I, NP  famotidine (PEPCID) 20 MG tablet Take 1 tablet (20 mg total) by mouth every morning. For acid reflux 09/15/17   Armandina StammerNwoko, Agnes I, NP  hydrOXYzine (ATARAX/VISTARIL) 25 MG tablet Take 1 tablet (25 mg total) by mouth every 6 (six) hours as needed for anxiety. 09/15/17   Armandina StammerNwoko, Agnes I, NP  levothyroxine (SYNTHROID, LEVOTHROID) 112 MCG tablet Take 1 tablet (112 mcg total) by mouth daily before breakfast. For hypothyroidism 09/15/17   Armandina StammerNwoko, Agnes I, NP  risperiDONE (RISPERDAL) 0.5 MG tablet Take 1 tablet (0.5 mg total) by mouth at bedtime. For mood control 09/15/17   Armandina StammerNwoko, Agnes I, NP  traZODone (DESYREL) 50 MG tablet Take 1 tablet (50 mg total) by mouth at bedtime as needed for sleep. 09/15/17   Sanjuana KavaNwoko, Agnes I, NP    ALLERGIES:  Allergies  Allergen Reactions  . Latex Rash    NO POWDERED GLOVES, please!!    SOCIAL HISTORY:  Social History   Tobacco Use  . Smoking status: Former Games developermoker  . Smokeless tobacco: Never Used  Substance Use Topics  . Alcohol use: No    FAMILY HISTORY: No family history on file.  EXAM: BP 138/88   Pulse 74   Temp 98.3 F (36.8 C) (Oral)   Resp 18   SpO2 97%  CONSTITUTIONAL: Alert and  oriented and responds appropriately to questions. Well-appearing; well-nourished HEAD: Normocephalic EYES: Conjunctivae clear, pupils appear equal, EOMI ENT: normal nose; moist mucous membranes NECK: Supple, no meningismus, no nuchal rigidity, no LAD  CARD: RRR; S1 and S2 appreciated; no murmurs, no clicks, no rubs, no gallops RESP: Normal chest excursion without splinting or tachypnea; breath sounds clear and equal bilaterally; no wheezes, no rhonchi, no rales, no hypoxia or respiratory distress, speaking full sentences ABD/GI: Normal bowel sounds; non-distended; soft, non-tender, no rebound, no guarding, no peritoneal signs, no hepatosplenomegaly BACK:  The back appears normal and is  non-tender to palpation, there is no CVA tenderness EXT: Normal ROM in all joints; non-tender to palpation; no edema; normal capillary refill; no cyanosis, no calf tenderness or swelling    SKIN: Normal color for age and race; warm; no rash NEURO: Moves all extremities equally PSYCH: Reports suicidal thoughts with plan.  No HI or hallucinations. Grooming and personal hygiene are appropriate.  MEDICAL DECISION MAKING: Patient here for suicidality..  He does have a mildly elevated creatinine but this appears to be his baseline.  He is medically cleared.  Will consult TTS as patient is unable to contract for safety.  He was last admitted to a psychiatric hospital at the end of September 2018.  ED PROGRESS: Patient medically cleared.  He is awaiting TTS evaluation for psychiatric disposition.  Here voluntarily at this time.  I reviewed all nursing notes, vitals, pertinent previous records, EKGs, lab and urine results, imaging (as available).      Yuriana Gaal, Layla MawKristen N, DO 10/24/17 [phone]

## 2017-10-24 NOTE — ED Notes (Signed)
Belongings logged and placed in locker 14

## 2017-10-24 NOTE — ED Notes (Signed)
TTS in progress 

## 2017-11-22 ENCOUNTER — Other Ambulatory Visit: Payer: Self-pay

## 2017-11-22 ENCOUNTER — Emergency Department (HOSPITAL_COMMUNITY)
Admission: EM | Admit: 2017-11-22 | Discharge: 2017-11-25 | Disposition: A | Discharge status: Other Acute Inpatient Hospital | Payer: Medicare HMO | Attending: Emergency Medicine | Admitting: Emergency Medicine

## 2017-11-22 ENCOUNTER — Encounter (HOSPITAL_COMMUNITY): Payer: Self-pay | Admitting: Emergency Medicine

## 2017-11-22 DIAGNOSIS — R45851 Suicidal ideations: Secondary | ICD-10-CM

## 2017-11-22 DIAGNOSIS — Z79899 Other long term (current) drug therapy: Secondary | ICD-10-CM | POA: Insufficient documentation

## 2017-11-22 DIAGNOSIS — Z7982 Long term (current) use of aspirin: Secondary | ICD-10-CM | POA: Diagnosis not present

## 2017-11-22 DIAGNOSIS — Z87891 Personal history of nicotine dependence: Secondary | ICD-10-CM | POA: Diagnosis not present

## 2017-11-22 DIAGNOSIS — F430 Acute stress reaction: Secondary | ICD-10-CM | POA: Diagnosis not present

## 2017-11-22 LAB — CBC
HEMATOCRIT: 45.5 % (ref 39.0–52.0)
HEMOGLOBIN: 15.4 g/dL (ref 13.0–17.0)
MCH: 29.7 pg (ref 26.0–34.0)
MCHC: 33.8 g/dL (ref 30.0–36.0)
MCV: 87.7 fL (ref 78.0–100.0)
Platelets: 167 10*3/uL (ref 150–400)
RBC: 5.19 MIL/uL (ref 4.22–5.81)
RDW: 13.6 % (ref 11.5–15.5)
WBC: 8.7 10*3/uL (ref 4.0–10.5)

## 2017-11-22 LAB — COMPREHENSIVE METABOLIC PANEL
ALBUMIN: 4.2 g/dL (ref 3.5–5.0)
ALK PHOS: 116 U/L (ref 38–126)
ALT: 24 U/L (ref 17–63)
ANION GAP: 11 (ref 5–15)
AST: 25 U/L (ref 15–41)
BILIRUBIN TOTAL: 0.9 mg/dL (ref 0.3–1.2)
BUN: 9 mg/dL (ref 6–20)
CALCIUM: 8.8 mg/dL — AB (ref 8.9–10.3)
CO2: 22 mmol/L (ref 22–32)
Chloride: 103 mmol/L (ref 101–111)
Creatinine, Ser: 1.51 mg/dL — ABNORMAL HIGH (ref 0.61–1.24)
GFR calc Af Amer: 59 mL/min — ABNORMAL LOW (ref >60)
GFR calc non Af Amer: 51 mL/min — ABNORMAL LOW (ref >60)
GLUCOSE: 96 mg/dL (ref 65–99)
Potassium: 3.7 mmol/L (ref 3.5–5.1)
SODIUM: 136 mmol/L (ref 135–145)
TOTAL PROTEIN: 7.1 g/dL (ref 6.5–8.1)

## 2017-11-22 LAB — SALICYLATE LEVEL: Salicylate Lvl: <7 mg/dL (ref 2.8–30.0)

## 2017-11-22 LAB — RAPID URINE DRUG SCREEN, HOSP PERFORMED
Amphetamines: NOT DETECTED
BARBITURATES: NOT DETECTED
BENZODIAZEPINES: NOT DETECTED
COCAINE: NOT DETECTED
Opiates: NOT DETECTED
TETRAHYDROCANNABINOL: NOT DETECTED

## 2017-11-22 LAB — ACETAMINOPHEN LEVEL

## 2017-11-22 LAB — ETHANOL: Alcohol, Ethyl (B): <10 mg/dL (ref <10)

## 2017-11-22 MED ORDER — TRAZODONE HCL 50 MG PO TABS: 50.0000 mg | ORAL_TABLET | Freq: Every evening | ORAL | Status: DC | PRN

## 2017-11-22 MED ORDER — ASPIRIN EC 81 MG PO TBEC
81.0000 mg | DELAYED_RELEASE_TABLET | Freq: Every day | ORAL | Status: DC
  Administered 2017-11-22: 81 mg via ORAL
  Filled 2017-11-22: qty 1

## 2017-11-22 MED ORDER — ONDANSETRON HCL 4 MG PO TABS: 4.0000 mg | ORAL_TABLET | Freq: Three times a day (TID) | ORAL | Status: DC | PRN

## 2017-11-22 MED ORDER — DOCUSATE SODIUM 100 MG PO CAPS: 100.0000 mg | ORAL_CAPSULE | Freq: Every day | ORAL | Status: DC | PRN

## 2017-11-22 MED ORDER — LEVOTHYROXINE SODIUM 112 MCG PO TABS
112.0000 ug | ORAL_TABLET | Freq: Every day | ORAL | Status: DC
  Administered 2017-11-23 – 2017-11-25 (×2): 112 ug via ORAL
  Filled 2017-11-22 (×3): qty 1

## 2017-11-22 MED ORDER — ACETAMINOPHEN 325 MG PO TABS: 325.0000 mg | ORAL_TABLET | Freq: Four times a day (QID) | ORAL | Status: DC | PRN

## 2017-11-22 MED ORDER — ALUM & MAG HYDROXIDE-SIMETH 200-200-20 MG/5ML PO SUSP: 30.0000 mL | Freq: Four times a day (QID) | ORAL | Status: DC | PRN

## 2017-11-22 MED ORDER — CITALOPRAM HYDROBROMIDE 10 MG PO TABS
20.0000 mg | ORAL_TABLET | Freq: Every day | ORAL | Status: DC
  Administered 2017-11-22 – 2017-11-25 (×3): 20 mg via ORAL
  Filled 2017-11-22 (×4): qty 2

## 2017-11-22 MED ORDER — ATORVASTATIN CALCIUM 40 MG PO TABS
40.0000 mg | ORAL_TABLET | Freq: Every day | ORAL | Status: DC
  Administered 2017-11-22: 40 mg via ORAL
  Filled 2017-11-22: qty 1

## 2017-11-22 MED ORDER — RISPERIDONE 0.5 MG PO TABS
0.5000 mg | ORAL_TABLET | Freq: Every day | ORAL | Status: DC
  Administered 2017-11-22: 0.5 mg via ORAL
  Filled 2017-11-22: qty 1

## 2017-11-22 MED ORDER — HYDROXYZINE HCL 25 MG PO TABS: 25.0000 mg | ORAL_TABLET | Freq: Four times a day (QID) | ORAL | Status: DC | PRN

## 2017-11-22 NOTE — ED Notes (Signed)
Pt changed into burgundy scrubs, belongings placed in belongings bags and taken to nurse's station; pt wanded by security, pt placed in A hallway

## 2017-11-22 NOTE — ED Notes (Signed)
Devin Joseph (wife): [phone]

## 2017-11-22 NOTE — ED Notes (Signed)
Pt voiced understanding and agreement w/Medical Clearance Pt Policy - signed - copy given to pt.

## 2017-11-22 NOTE — ED Notes (Signed)
[facility], advised aware of [facility] order. Danny to help perform.
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
## 2017-11-22 NOTE — ED Provider Notes (Signed)
[facility] Provider Note   CSN: [id number] Arrival date & time: 11/22/17  1409     History   Chief Complaint Chief Complaint  Patient presents with  . Suicidal    HPI Devin Joseph is a 56 y.o. male.  The history is provided by the patient.   56 year old male who presents with suicidal ideation.  He has a history of depression and suicidal behavior.  He states that he has been hospitalized in the past for psychiatric treatment although is not been helpful to him.  Also states previous suicide attempts.  Reports that he has had increased stress related to arguments with his mother.  Yesterday evening he expressed to his wife that he wanted to shoot himself in the head with a gun.  This morning, his wife did not feel comfortable leaving him at home to be with himself after he expressed his thoughts to her.  She brought him to the ED for evaluation.  He states that currently he denies any suicidal ideation.  Denies any homicidal thoughts, hallucinations.  Recently with some diarrhea but no other acute illness. Past Medical History:  Diagnosis Date  . Depression   . Suicidal behavior without attempted self-injury   . Thyroid disease     Patient Active Problem List   Diagnosis Date Noted  . Severe recurrent major depression without psychotic features (HCC) 09/10/2017    Past Surgical History:  Procedure Laterality Date  . CHOLECYSTECTOMY    . HERNIA REPAIR         Home Medications    Prior to Admission medications   Medication Sig Start Date End Date Taking? Authorizing Provider  acetaminophen (TYLENOL) 325 MG tablet Take 1 tablet (325 mg total) by mouth every 6 (six) hours as needed (for pain or headaches). 09/15/17   Armandina StammerNwoko, Agnes I, NP  aspirin EC 81 MG tablet Take 1 tablet (81 mg total) by mouth at bedtime. For heart health 09/15/17   Armandina StammerNwoko, Agnes I, NP  atorvastatin (LIPITOR) 40 MG tablet Take 1 tablet (40 mg total) by mouth at  bedtime. For high Cholesterol 09/15/17   Armandina StammerNwoko, Agnes I, NP  citalopram (CELEXA) 20 MG tablet Take 1 tablet (20 mg total) daily for 14 days by mouth. For depression 10/24/17 11/07/17  Azalia Bilisampos, Kevin, MD  docusate sodium (COLACE) 100 MG capsule Take 1 capsule (100 mg total) by mouth daily as needed for mild constipation. 09/15/17   Armandina StammerNwoko, Agnes I, NP  hydrOXYzine (ATARAX/VISTARIL) 25 MG tablet Take 1 tablet (25 mg total) by mouth every 6 (six) hours as needed for anxiety. 09/15/17   Armandina StammerNwoko, Agnes I, NP  levothyroxine (SYNTHROID, LEVOTHROID) 112 MCG tablet Take 1 tablet (112 mcg total) by mouth daily before breakfast. For hypothyroidism 09/15/17   Armandina StammerNwoko, Agnes I, NP  risperiDONE (RISPERDAL) 0.5 MG tablet Take 1 tablet (0.5 mg total) at bedtime for 14 days by mouth. For mood control 10/24/17 11/07/17  Azalia Bilisampos, Kevin, MD  traZODone (DESYREL) 50 MG tablet Take 1 tablet (50 mg total) by mouth at bedtime as needed for sleep. 09/15/17   Sanjuana KavaNwoko, Agnes I, NP    Family History No family history on file.  Social History Social History   Tobacco Use  . Smoking status: Former Games developermoker  . Smokeless tobacco: Never Used  Substance Use Topics  . Alcohol use: No  . Drug use: No     Allergies   Latex   Review of Systems Review of Systems  Constitutional: Negative for fever.  Respiratory: Negative for shortness of breath.   Cardiovascular: Negative for chest pain.  Gastrointestinal: Positive for diarrhea. Negative for abdominal pain.  Genitourinary: Negative for dysuria.  All other systems reviewed and are negative.    Physical Exam Updated Vital Signs BP 124/83 (BP Location: Right Arm)   Pulse 67   Temp 98.4 F (36.9 C) (Oral)   Resp 16   Ht 5\' 4"  (1.626 m)   Wt 82.6 kg (182 lb)   SpO2 99%   BMI 31.24 kg/m   Physical Exam Physical Exam  Nursing note and vitals reviewed. Constitutional: Well developed, well nourished, non-toxic, and in no acute distress Head: Normocephalic and atraumatic.    Mouth/Throat: Oropharynx is clear and moist.  Neck: Normal range of motion. Neck supple.  Cardiovascular: Normal rate and regular rhythm.   Pulmonary/Chest: Effort normal and breath sounds normal.  Abdominal: Soft. There is no tenderness. There is no rebound and no guarding.  Musculoskeletal: Normal range of motion.  Neurological: Alert, no facial droop, fluent speech, moves all extremities symmetrically Skin: Skin is warm and dry.  Psychiatric: Cooperative   ED Treatments / Results  Labs (all labs ordered are listed, but only abnormal results are displayed) Labs Reviewed  COMPREHENSIVE METABOLIC PANEL - Abnormal; Notable for the following components:      Result Value   Creatinine, Ser 1.51 (*)    Calcium 8.8 (*)    GFR calc non Af Amer 51 (*)    GFR calc Af Amer 59 (*)    All other components within normal limits  ACETAMINOPHEN LEVEL - Abnormal; Notable for the following components:   Acetaminophen (Tylenol), Serum <10 (*)    All other components within normal limits  ETHANOL  SALICYLATE LEVEL  CBC  RAPID URINE DRUG SCREEN, HOSP PERFORMED    EKG  EKG Interpretation None       Radiology No results found.  Procedures Procedures (including critical care time)  Medications Ordered in ED Medications - No data to display   Initial Impression / Assessment and Plan / ED Course  I have reviewed the triage vital signs and the nursing notes.  Pertinent labs & imaging results that were available during my care of the patient were reviewed by me and considered in my medical decision making (see chart for details).     Patient well-appearing, comfortably resting, cooperative with history and exam.  Vital signs are normal.  Exam nonfocal.  Blood work overall reassuring.  Stable renal function.  He is felt medically cleared for TTS consult.  Final Clinical Impressions(s) / ED Diagnoses   Final diagnoses:  Suicidal thoughts    ED Discharge Orders    None        Lavera GuiseLiu, Raydell Maners Duo, MD 11/22/17 1550

## 2017-11-22 NOTE — ED Notes (Signed)
Pt declined snack.  
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
## 2017-11-22 NOTE — ED Notes (Signed)
Pt arrived to F2 - ambulatory. Pt wearing burgundy scrubs and eyeglasses. Pt noted to be calm, cooperative, pleasant.

## 2017-11-22 NOTE — ED Notes (Signed)
TTS being performed - partially w/Telepsych cart and phone d/t poor wireless connection.

## 2017-11-22 NOTE — Progress Notes (Signed)
Per previous TTS counselor, 11/22/17 Per Dr Gilmore LarocheAkhtar, patient will need to be monitored overnight for safety, administer home medications and patient will be re-evaluated in the am. Pt's nurse Kriste BasqueBecky, RN and EDP Dr. Freida BusmanAllen, MD have been notified of disposition.  Princess BruinsAquicha Mohanad Carsten, MSW, LCSW Therapeutic Triage Specialist  [phone]

## 2017-11-23 NOTE — ED Notes (Signed)
Pt trying to bang head on door way again.  Stating "I just want this to end". "I'm going to sue you" "I have rights" "you can't make me stop". Ask pt to return to bed or security will be called. States that he will not go back to bed. Ask pt if he would take his medications that might help him feel a little better, Pt states "No". Pt returned to bed and states "I'll just hold my breath and die".  Ask pt again if he would take his nightly meds and pt states "no".

## 2017-11-23 NOTE — BH Assessment (Signed)
[facility] Assessment Progress Note     TTS Reassessment:  Patient was seen and states that he continues to feel anxious.  States that he did not sleep that well, but his appetite has been okay.  When asked if he was suicidal today, he said that he was not longer feeling like he wants to hurt himself.  He states that he is not having any thoughts about hurting others.  Patient states: "I don't want to go home."  Patient states that even though he is not suicidal/homicidal, he states that he still feels depressed and states that he feels like his medications are not working.  TTS will consult with provider on patient's disposition.

## 2017-11-23 NOTE — ED Notes (Signed)
Pt's spouse, Lupita LeashDonna, called and requested to speak w/pt. Advised pt - pt stated she did not want to talk to her. Advised spouse - stated she will leave it up to him to call her when he is ready.

## 2017-11-23 NOTE — ED Notes (Signed)
Pt woke briefly - advised him his spouse called earlier. Stated "I don't want to talk to her" then returned to sleeping.

## 2017-11-23 NOTE — BH Assessment (Signed)
[facility] Assessment Progress Note    Consulted with Leighton Ruffina Okonkwo, NP, who recommends inpatient treatment for patient.  Will continue to seek placement for him.

## 2017-11-23 NOTE — ED Notes (Signed)
Re-TTS completed.  

## 2017-11-23 NOTE — ED Notes (Signed)
Lupita LeashDonna, pt's spouse, called - advised her advise pt when he awakens that she called. She stated he may not want to call her back as he was upset w/her and his mother when he came to ED.

## 2017-11-23 NOTE — ED Notes (Signed)
Charge RN aware no Comptrolleritter available for pt. RN observing pt.

## 2017-11-23 NOTE — ED Notes (Signed)
Pt refused all nightly medications. Pt lying in bed at this time. Will continue to monitor. States that he is going to hold his breath until he dies.

## 2017-11-23 NOTE — ED Notes (Signed)
Offered pt to shower this am - declined - states will later.

## 2017-11-23 NOTE — ED Provider Notes (Signed)
Asked by nurse to see patient because he was hitting his head against the door jam.  At this time patient is resting comfortably in bed and states that he does not want anything, because "I do not want to live."  He took Celexa this morning, but has not had any other sedating medications.  He was offered hydroxyzine, but declined.  He is currently voluntary.  So far he has been redirectable, therefore we will not be committed.  I told the patient that if he continues to show signs of self-harm such as hitting his head on a door, that he would be physically and chemically restrained and committed.  He refused to acknowledge verbally if he understood, but I believe that he heard me and likely understands the implication of future behavior of self-harm.   Mancel BaleWentz, Taneal Sonntag, MD 11/23/17 2114

## 2017-11-23 NOTE — ED Notes (Signed)
Pt in doorway holding on to doorframe and slowly banging head against the door frame. Pt asked to stop but Pt continued. This tech with assistance from RN-David helped Pt back to bed. Pt then got up to try to bang head on frame again. This tech stopped Pt and asked Pt to get back in his bed. Pt willingly got back in bed but is now banging head on his bed.

## 2017-11-23 NOTE — Progress Notes (Signed)
Referred pt for inpatient treatment at recommendation of TTS assessment this morning.  [facility] upon ability to admit pts as well.  Noted facilities contacted advised due to weather conditions, unlikely will be able to admit pts today, but referral made for review when conditions improve. [facility]  Devin Joseph, Devin Joseph, [place]LCSW Clinical Social Work 11/23/2017 [phone]

## 2017-11-23 NOTE — ED Notes (Signed)
Md called for orders. Dr. Effie ShyWentz to come see pt. Pt returned to bed. States that "I just want to die". "I have no life". Pt lying on bed with his hands around his neck trying to make himself "die".

## 2017-11-23 NOTE — ED Notes (Signed)
Pt up at the wall resting his head against it. Threatening to "split his head open". Told pt the consequences of his actions which would be, per md, status changing to IVC and being given medication via injection. Pt states "I don't care." "it will give me something to do". Charge RN called. Have 2 sitters for 5 patients. Sitters with this pt and another room close by. Will continue to monitor.

## 2017-11-24 NOTE — BH Assessment (Signed)
[facility]   Clinician informed nurse Delorise Jacksonori that [facility]  [facility]  Patient will come tomorrow when daytime AC calls to let [facility] know that patient can come over.

## 2017-11-24 NOTE — ED Notes (Signed)
Pt called out stating "Nurse I'm going to hit my head against the wall"

## 2017-11-24 NOTE — Progress Notes (Signed)
[facility](Chris in Admissions) notified. Pt. can be transferred after transportation available 12/11. AC will arrange time of transport.          Denver FasterVictoria Haely Leyland RN-BC A/C [facility]

## 2017-11-24 NOTE — ED Triage Notes (Signed)
PT refused lunch. 

## 2017-11-24 NOTE — ED Notes (Signed)
[facility], [facility]. [facility] will call when ready

## 2017-11-24 NOTE — ED Triage Notes (Signed)
[facility] been [facility] and can go after 9AM on 12-11-8. [facility] reported they can not transport to [facility] 11-24-17.

## 2017-11-24 NOTE — ED Notes (Signed)
Pt calls out stating "I'm holding my breath"

## 2017-11-24 NOTE — ED Notes (Signed)
Pt calls out requesting a doctor, states he is going to hit his head on the wall again. Call bell removed from pt room, TV turned off and lights cut down.

## 2017-11-24 NOTE — ED Notes (Signed)
Pt states he is refusing his meds

## 2017-11-24 NOTE — ED Triage Notes (Signed)
Pt in bed on his back. Pt had lt hand on his neck and stated " I am going to choke myself."

## 2017-11-24 NOTE — ED Triage Notes (Signed)
PT refused breakfast

## 2017-11-24 NOTE — BH Assessment (Signed)
BHH Assessment Progress Note  Minerva Areolaric from [facility]. Accepting doctor is Jabil Circuithomas Cornwall. Call report to [phone]. Pt can come anytime today or after 9am tomorrow if transportation is an issue. If cannot arrive today, call to let them know. Magda PaganiniAudrey, RN, notified.   Johny ShockSamantha M. Ladona Ridgelaylor, MS, NCC, LPCA Counselor

## 2017-11-24 NOTE — ED Triage Notes (Signed)
Pt mother called to talk with Pt. Mr Devin Joseph  Refused the call. Pt mother reported the Pt also refused to talk to his wife.

## 2017-11-24 NOTE — BH Assessment (Signed)
BHH Assessment Progress Note   Pt was awaken by the nurse but was alert after waken. Pt reports he still wants to harm himself. Pt reports he did not sleep well. Pt reports he doesn't want to eat. Pt had flat affect.

## 2017-11-25 ENCOUNTER — Other Ambulatory Visit: Payer: Self-pay

## 2017-11-25 ENCOUNTER — Inpatient Hospital Stay (HOSPITAL_COMMUNITY)
Admission: AD | Admit: 2017-11-25 | Discharge: 2017-11-30 | DRG: 885 | Disposition: A | Discharge status: Home / Self Care | Payer: Medicare HMO | Source: Intra-hospital | Attending: Emergency Medicine | Admitting: Emergency Medicine

## 2017-11-25 ENCOUNTER — Encounter (HOSPITAL_COMMUNITY): Payer: Self-pay

## 2017-11-25 DIAGNOSIS — G479 Sleep disorder, unspecified: Secondary | ICD-10-CM | POA: Diagnosis not present

## 2017-11-25 DIAGNOSIS — Z79899 Other long term (current) drug therapy: Secondary | ICD-10-CM | POA: Diagnosis not present

## 2017-11-25 DIAGNOSIS — E7800 Pure hypercholesterolemia, unspecified: Secondary | ICD-10-CM | POA: Diagnosis not present

## 2017-11-25 DIAGNOSIS — Z87891 Personal history of nicotine dependence: Secondary | ICD-10-CM | POA: Diagnosis not present

## 2017-11-25 DIAGNOSIS — Z888 Allergy status to other drugs, medicaments and biological substances status: Secondary | ICD-10-CM

## 2017-11-25 DIAGNOSIS — F332 Major depressive disorder, recurrent severe without psychotic features: Secondary | ICD-10-CM | POA: Diagnosis not present

## 2017-11-25 DIAGNOSIS — G4710 Hypersomnia, unspecified: Secondary | ICD-10-CM | POA: Diagnosis present

## 2017-11-25 DIAGNOSIS — E039 Hypothyroidism, unspecified: Secondary | ICD-10-CM | POA: Diagnosis present

## 2017-11-25 DIAGNOSIS — Z811 Family history of alcohol abuse and dependence: Secondary | ICD-10-CM

## 2017-11-25 DIAGNOSIS — R51 Headache: Secondary | ICD-10-CM | POA: Diagnosis not present

## 2017-11-25 DIAGNOSIS — Z7982 Long term (current) use of aspirin: Secondary | ICD-10-CM

## 2017-11-25 DIAGNOSIS — F39 Unspecified mood [affective] disorder: Secondary | ICD-10-CM | POA: Diagnosis not present

## 2017-11-25 DIAGNOSIS — R4189 Other symptoms and signs involving cognitive functions and awareness: Secondary | ICD-10-CM

## 2017-11-25 DIAGNOSIS — R45851 Suicidal ideations: Secondary | ICD-10-CM | POA: Diagnosis not present

## 2017-11-25 DIAGNOSIS — Z91040 Latex allergy status: Secondary | ICD-10-CM

## 2017-11-25 DIAGNOSIS — F430 Acute stress reaction: Secondary | ICD-10-CM | POA: Diagnosis not present

## 2017-11-25 DIAGNOSIS — F419 Anxiety disorder, unspecified: Secondary | ICD-10-CM | POA: Diagnosis not present

## 2017-11-25 MED ORDER — ACETAMINOPHEN 325 MG PO TABS: 325.0000 mg | ORAL_TABLET | Freq: Four times a day (QID) | ORAL | Status: DC | PRN

## 2017-11-25 MED ORDER — HYDROXYZINE HCL 25 MG PO TABS
25.0000 mg | ORAL_TABLET | Freq: Four times a day (QID) | ORAL | Status: DC | PRN
  Administered 2017-11-25 – 2017-11-28 (×2): 25 mg via ORAL
  Filled 2017-11-25 (×2): qty 1

## 2017-11-25 MED ORDER — CITALOPRAM HYDROBROMIDE 20 MG PO TABS
20.0000 mg | ORAL_TABLET | Freq: Every day | ORAL | Status: DC
Start: 2017-11-26 — End: 2017-12-01
  Administered 2017-11-26 – 2017-11-30 (×5): 20 mg via ORAL
  Filled 2017-11-25 (×8): qty 1

## 2017-11-25 MED ORDER — ASPIRIN EC 81 MG PO TBEC
81.0000 mg | DELAYED_RELEASE_TABLET | Freq: Every day | ORAL | Status: DC
  Administered 2017-11-25 – 2017-11-29 (×5): 81 mg via ORAL
  Filled 2017-11-25 (×9): qty 1

## 2017-11-25 MED ORDER — LEVOTHYROXINE SODIUM 112 MCG PO TABS
112.0000 ug | ORAL_TABLET | Freq: Every day | ORAL | Status: DC
  Administered 2017-11-26 – 2017-11-30 (×5): 112 ug via ORAL
  Filled 2017-11-25 (×9): qty 1

## 2017-11-25 MED ORDER — ONDANSETRON HCL 4 MG PO TABS: 4.0000 mg | ORAL_TABLET | Freq: Three times a day (TID) | ORAL | Status: DC | PRN

## 2017-11-25 MED ORDER — RISPERIDONE 0.5 MG PO TABS
0.5000 mg | ORAL_TABLET | Freq: Every day | ORAL | Status: DC
  Administered 2017-11-25: 0.5 mg via ORAL
  Filled 2017-11-25 (×3): qty 1

## 2017-11-25 MED ORDER — DOCUSATE SODIUM 100 MG PO CAPS: 100.0000 mg | ORAL_CAPSULE | Freq: Every day | ORAL | Status: DC | PRN

## 2017-11-25 MED ORDER — ATORVASTATIN CALCIUM 40 MG PO TABS
40.0000 mg | ORAL_TABLET | Freq: Every day | ORAL | Status: DC
  Administered 2017-11-25 – 2017-11-29 (×5): 40 mg via ORAL
  Filled 2017-11-25 (×9): qty 1

## 2017-11-25 MED ORDER — MIRTAZAPINE 7.5 MG PO TABS
7.5000 mg | ORAL_TABLET | Freq: Every day | ORAL | Status: DC
Start: 2017-11-26 — End: 2017-11-28
  Administered 2017-11-26 – 2017-11-27 (×2): 7.5 mg via ORAL
  Filled 2017-11-25 (×4): qty 1

## 2017-11-25 MED ORDER — ALUM & MAG HYDROXIDE-SIMETH 200-200-20 MG/5ML PO SUSP: 30.0000 mL | Freq: Four times a day (QID) | ORAL | Status: DC | PRN

## 2017-11-25 NOTE — ED Notes (Signed)
Pt to nurses station apologizing. Call bell returned to pt under condition that he stops calling out for attention stating he is going to hurt himself. Pt aware call bell will be removed if he continues.

## 2017-11-25 NOTE — Progress Notes (Signed)
Devin Joseph is a 56 y.o. male voluntarily admitted for SI with plan to shoot self in head from [facility]. When asked what brought patient here patient states "my wife" and reports "my wife and my mom are my stressors." Lives with wife and mother and states they all argue a lot and it's stressing him out.  States intermittently has thoughts to shoot self in head but agrees to come to staff before acting on any harmful thoughts.  Recent history of banging head on wall at [facility] and threatening to harm self on NOC's, but since has apologized and promised not to attempt to harm self.  Ag[facility] and come to staff if he feels unsafe.  Medical and surgical history reviewed and noted below, pertinent history includes thyroid disease.  Basic search of patient completed with skin check completed, within normal limits.  Belongings reviewed and noted on belongings record.  Oriented to unit and rules, consents and treatment agreement reviewed with patient and signed.  Allergies  Allergen Reactions  . Trazodone And Nefazodone Other (See Comments)    Causes muscle jerks/tremors per spouse  . Latex Rash    NO POWDERED GLOVES, please!!   Past Medical History:  Diagnosis Date  . Depression   . Suicidal behavior without attempted self-injury   . Thyroid disease    Past Surgical History:  Procedure Laterality Date  . CHOLECYSTECTOMY    . HERNIA REPAIR

## 2017-11-25 NOTE — ED Notes (Signed)
[facility] transported arrived and given pt's belongings. [facility]

## 2017-11-25 NOTE — Progress Notes (Signed)
Pt has been sitting in the dayroom all evening watching TV, but staying mostly to himself, with no observed interaction with other patients.  He is new to the unit today.  He reports that he is tired of dealing with his wife and mother at home.  He reports that they are his main stressors.  He said earlier that his mother tried to call [facility] and wanted to talk to him, but he said he refused.  "I'm not ready to talk to her."  Pt was assured that he did not have to talk to his family until he was ready.  He contracts for safety on the unit.  He denies HI/AVH.  He took his evening meds without incident.  Support and encouragement offered.  Discharge plans are in process.  Safety maintained with q15 minute checks.

## 2017-11-25 NOTE — ED Notes (Signed)
Patient was given Coke and Mal[place]urkey Sandwich bag, a Regular Diet Taken for [employer]unch.
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
## 2017-11-25 NOTE — ED Notes (Signed)
This RN called for [facility]. Pt's belongings obtained and will be given to transporter.

## 2017-11-25 NOTE — Tx Team (Signed)
Initial Treatment Plan 11/25/2017 4:13 PM Ardelia MemsJames D Gwilliam MWN:[id number]    PATIENT STRESSORS: Marital or family conflict   PATIENT STRENGTHS: Physical Health Supportive family/friends   PATIENT IDENTIFIED PROBLEMS: "It ain't nothin' gonna change."                     DISCHARGE CRITERIA:  Improved stabilization in mood, thinking, and/or behavior Need for constant or close observation no longer present  PRELIMINARY DISCHARGE PLAN: Outpatient therapy  PATIENT/FAMILY INVOLVEMENT: This treatment plan has been presented to and reviewed with the patient, Ardelia MemsJames D Wishart.  The patient and family have been given the opportunity to ask questions and make suggestions.  Lindajo Royalaniel P Mykira Hofmeister, RN 11/25/2017, 4:13 PM

## 2017-11-25 NOTE — ED Notes (Signed)
[facility] Okaan[facility]

## 2017-11-25 NOTE — ED Notes (Signed)
Regular Diet was ordered for Lunch. 

## 2017-11-26 ENCOUNTER — Emergency Department (HOSPITAL_COMMUNITY): Admission: EM | Admit: 2017-11-26 | Discharge: 2017-11-26 | Discharge status: Absent without leave | Payer: Medicare HMO

## 2017-11-26 ENCOUNTER — Encounter (HOSPITAL_COMMUNITY): Payer: Self-pay | Admitting: Radiology

## 2017-11-26 ENCOUNTER — Inpatient Hospital Stay (HOSPITAL_COMMUNITY): Payer: Medicare HMO

## 2017-11-26 LAB — RAPID URINE DRUG SCREEN, HOSP PERFORMED
Amphetamines: NOT DETECTED
BENZODIAZEPINES: NOT DETECTED
Barbiturates: NOT DETECTED
COCAINE: NOT DETECTED
Opiates: NOT DETECTED
Tetrahydrocannabinol: NOT DETECTED

## 2017-11-26 LAB — CBC WITH DIFFERENTIAL/PLATELET
Basophils Absolute: 0 10*3/uL (ref 0.0–0.1)
Basophils Relative: 0 %
EOS PCT: 4 %
Eosinophils Absolute: 0.3 10*3/uL (ref 0.0–0.7)
HCT: 47.9 % (ref 39.0–52.0)
Hemoglobin: 16.4 g/dL (ref 13.0–17.0)
LYMPHS ABS: 2.7 10*3/uL (ref 0.7–4.0)
LYMPHS PCT: 34 %
MCH: 30.4 pg (ref 26.0–34.0)
MCHC: 34.2 g/dL (ref 30.0–36.0)
MCV: 88.7 fL (ref 78.0–100.0)
MONO ABS: 0.9 10*3/uL (ref 0.1–1.0)
MONOS PCT: 11 %
Neutro Abs: 4.1 10*3/uL (ref 1.7–7.7)
Neutrophils Relative %: 51 %
PLATELETS: 162 10*3/uL (ref 150–400)
RBC: 5.4 MIL/uL (ref 4.22–5.81)
RDW: 13.8 % (ref 11.5–15.5)
WBC: 7.9 10*3/uL (ref 4.0–10.5)

## 2017-11-26 LAB — COMPREHENSIVE METABOLIC PANEL
ALT: 22 U/L (ref 17–63)
AST: 24 U/L (ref 15–41)
Albumin: 4.7 g/dL (ref 3.5–5.0)
Alkaline Phosphatase: 111 U/L (ref 38–126)
Anion gap: 11 (ref 5–15)
BUN: 25 mg/dL — ABNORMAL HIGH (ref 6–20)
CHLORIDE: 107 mmol/L (ref 101–111)
CO2: 24 mmol/L (ref 22–32)
CREATININE: 1.53 mg/dL — AB (ref 0.61–1.24)
Calcium: 9.9 mg/dL (ref 8.9–10.3)
GFR, EST AFRICAN AMERICAN: 58 mL/min — AB (ref >60)
GFR, EST NON AFRICAN AMERICAN: 50 mL/min — AB (ref >60)
Glucose, Bld: 98 mg/dL (ref 65–99)
POTASSIUM: 4.1 mmol/L (ref 3.5–5.1)
Sodium: 142 mmol/L (ref 135–145)
TOTAL PROTEIN: 8.1 g/dL (ref 6.5–8.1)
Total Bilirubin: 1 mg/dL (ref 0.3–1.2)

## 2017-11-26 LAB — GLUCOSE, CAPILLARY: GLUCOSE-CAPILLARY: 102 mg/dL — AB (ref 65–99)

## 2017-11-26 LAB — ETHANOL

## 2017-11-26 MED ORDER — RISPERIDONE 1 MG PO TABS
1.0000 mg | ORAL_TABLET | Freq: Every day | ORAL | Status: DC
  Administered 2017-11-26 – 2017-11-29 (×4): 1 mg via ORAL
  Filled 2017-11-26 (×7): qty 1

## 2017-11-26 NOTE — Progress Notes (Signed)
Pt gave writer consents to provide his wife Devin Joseph with his 4 digit code number and to make her aware of [facility].

## 2017-11-26 NOTE — B[facility] Suicide Risk Assessment (Signed)
[facility]   Nursing information obtained from:  Patient Demographic factors:  Male Current Mental Status:  Suicidal ideation indicated by patient Loss Factors:  NA Historical Factors:  Prior suicide attempts, Impulsivity Risk Reduction Factors:  Positive social support  Total Time spent with patient: 45 minutes Principal Problem: MDD  Diagnosis:   Patient Active Problem List   Diagnosis Date Noted  . MDD (major depressive disorder), recurrent severe, without psychosis (HCC) [F33.2] 11/25/2017  . Severe recurrent major depression without psychotic features (HCC) [F33.2] 09/10/2017     Continued Clinical Symptoms:  Alcohol Use Disorder Identification Test Final Score (AUDIT): 0 The "Alcohol Use Disorders Identification Test", Guidelines for Use in Primary Care, Second Edition.  World Science writerHealth Organization [facility](WHO). Score between 0-7:  no or low risk or alcohol related problems. Score between 8-15:  moderate risk of alcohol related problems. Score between 16-19:  high risk of alcohol related problems. Score 20 or above:  warrants further diagnostic evaluation for alcohol dependence and treatment.   CLINICAL FACTORS:  56 year old married male, lives with mother and wife. History of learning disability. Recent admission in October for depression, SI. Presented to ED following argument with mother/spouse regarding a gaming system he wants to get and they are not wanting him to have . Ruminates about feeling they are overly controlling . Reported SI on admission to ED , as well as intermittent hallucinations.   Psychiatric Specialty Exam: Physical Exam  ROS  Blood pressure 105/75, pulse 91, temperature (!) 97.5 F (36.4 C), temperature source Oral, resp. rate 16, height 5\' 4"  (1.626 m), weight 82.6 kg (182 lb).Body mass index is 31.24 kg/m.   see admit note MSE    COGNITIVE FEATURES THAT CONTRIBUTE TO RISK:  Closed-mindedness and Loss of executive function     SUICIDE RISK:   Moderate:  Frequent suicidal ideation with limited intensity, and duration, some specificity in terms of plans, no associated intent, good self-control, limited dysphoria/symptomatology, some risk factors present, and identifiable protective factors, including available and accessible social support.  PLAN OF CARE: Patient will be admitted to inpatient psychiatric unit for stabilization and safety. Will provide and encourage milieu participation. Provide medication management and maked adjustments as needed.  Will follow daily.    I certify that inpatient services furnished can reasonably be expected to improve the patient's condition.   Craige CottaFernando A Darleth Eustache, MD 11/26/2017, 10:30 AM

## 2017-11-26 NOTE — Progress Notes (Addendum)
Around noon time Pt was noted to be unresponsive to Clinical research associatewriter on approach. Pt unresponsive to touch, pain, movement and voice. Pt v/s and CBG stable. Pt do not appear to be in distress. Pt eyes wide open and pupils non reactive to stimulation. Pt assessed by Dr. Jama Flavorsobos. EMS called per Dr. Jama Flavorsobos to have pt sent to ED for an eval.   Per NP, send  Pt to ED for eval. "Patient is currently unresponsive, pupils fixed, not reactive. Currently receiving mental health treatment for mood instability".

## 2017-11-26 NOTE — Discharge Instructions (Signed)
Follow-up as an outpatient with neurology.  You may need further workup including EEG and MRI.  Return immediately for any worsening or similar episodes.

## 2017-11-26 NOTE — Progress Notes (Signed)
Recreation Therapy Notes  Date: 11/26/17 Time: 0930 Location: [address] Dayroom  Group Topic: Stress Management  Goal Area(s) Addresses:  Patient will verbalize importance of using healthy stress management.  Patient will identify positive emotions associated with healthy stress management.   Intervention: Stress Management  Activity :  Guided Imagery.  LRT introduced the stress management technique of guided imagery.  LRT read a script that allowed patients to envision their peaceful place.  Patients were to follow along as script was read.  Education:  Stress Management, Discharge Planning.   Education Outcome: Acknowledges edcuation/In group clarification offered/Needs additional education  Clinical Observations/Feedback: Pt did not attend group.    Teshia Mahone, LRT/CTRS         Bailen Geffre A 11/26/2017 12:18 PM 

## 2017-11-26 NOTE — Progress Notes (Signed)
Pt presents with a flat affect and depressed mood. Pt endorses suicidal thoughts with no plan or intent. Pt verbally contracts for safety.  Pt reported difficulty sleeping last night due to racing thoughts and nightmares. Pt rates depression 10/10. Anxiety 10/10.  Medications reviewed with pt. Verbal support provided. Pt encouraged to attend groups. 15 minute checks performed for safety.

## 2017-11-26 NOTE — ED Provider Notes (Signed)
[facility]-EMERGENCY DEPT Provider Note   CSN: [id number] Arrival date & time: 11/25/17  1540     History   Chief Complaint Chief Complaint  Patient presents with  . Catatonic    HPI Ardelia MemsJames D Rothery is a 56 y.o. male.  HPI Patient recently admitted to [facility] for suicidal ideation.  Patient was found by staff and was just not responding to questioning.  Was noticed to move all extremities.  Patient is intermittently answering questions in the emergency department.  Is also intermittently following commands.  Level 5 caveat. Past Medical History:  Diagnosis Date  . Depression   . Suicidal behavior without attempted self-injury   . Thyroid disease     Patient Active Problem List   Diagnosis Date Noted  . MDD (major depressive disorder), recurrent severe, without psychosis (HCC) 11/25/2017  . Severe recurrent major depression without psychotic features (HCC) 09/10/2017    Past Surgical History:  Procedure Laterality Date  . CHOLECYSTECTOMY    . HERNIA REPAIR         Home Medications    Prior to Admission medications   Medication Sig Start Date End Date Taking? Authorizing Provider  acetaminophen (TYLENOL) 325 MG tablet Take 1 tablet (325 mg total) by mouth every 6 (six) hours as needed (for pain or headaches). Patient not taking: Reported on 11/22/2017 09/15/17   Armandina StammerNwoko, Agnes I, NP  acetaminophen (TYLENOL) 500 MG tablet Take 1,000 mg by mouth every 6 (six) hours as needed for headache (pain).    [provider]  aspirin EC 81 MG tablet Take 1 tablet (81 mg total) by mouth at bedtime. For heart health 09/15/17   Armandina StammerNwoko, Agnes I, NP  atorvastatin (LIPITOR) 40 MG tablet Take 1 tablet (40 mg total) by mouth at bedtime. For high Cholesterol 09/15/17   Armandina StammerNwoko, Agnes I, NP  busPIRone (BUSPAR) 10 MG tablet Take 10 mg by mouth 3 (three) times daily with meals.    [provider]  citalopram (CELEXA) 40 MG tablet Take 40 mg by mouth daily.     [provider]  docusate sodium (COLACE) 100 MG capsule Take 1 capsule (100 mg total) by mouth daily as needed for mild constipation. 09/15/17   Armandina StammerNwoko, Agnes I, NP  famotidine (PEPCID) 20 MG tablet Take 20 mg by mouth daily.    [provider]  hydrOXYzine (ATARAX/VISTARIL) 25 MG tablet Take 1 tablet (25 mg total) by mouth every 6 (six) hours as needed for anxiety. 09/15/17   Armandina StammerNwoko, Agnes I, NP  levothyroxine (SYNTHROID, LEVOTHROID) 112 MCG tablet Take 1 tablet (112 mcg total) by mouth daily before breakfast. For hypothyroidism 09/15/17   Armandina StammerNwoko, Agnes I, NP  risperiDONE (RISPERDAL) 0.5 MG tablet Take 0.5 mg by mouth at bedtime.    [provider]  traZODone (DESYREL) 50 MG tablet Take 1 tablet (50 mg total) by mouth at bedtime as needed for sleep. Patient not taking: Reported on 11/22/2017 09/15/17   Sanjuana KavaNwoko, Agnes I, NP    Family History History reviewed. No pertinent family history.  Social History Social History   Tobacco Use  . Smoking status: Former Games developermoker  . Smokeless tobacco: Never Used  Substance Use Topics  . Alcohol use: No  . Drug use: No     Allergies   Trazodone and nefazodone and Latex   Review of Systems Review of Systems  Unable to perform ROS: Mental status change     Physical Exam Updated Vital Signs BP 112/84  Pulse 78   Temp 98.4 F (36.9 C) (Oral)   Resp 17   Ht 5\' 4"  (1.626 m)   Wt 82.6 kg (182 lb)   SpO2 96%   BMI 31.24 kg/m   Physical Exam  Constitutional: He appears well-developed and well-nourished. No distress.  HENT:  Head: Normocephalic and atraumatic.  Mouth/Throat: Oropharynx is clear and moist.  No intraoral lesions.  Eyes: EOM are normal. Pupils are equal, round, and reactive to light.  Neck: Normal range of motion. Neck supple.  Diffuse posterior cervical tenderness to palpation.  No meningismus  Cardiovascular: Normal rate and regular rhythm. Exam reveals no gallop and no friction rub.  No murmur  heard. Pulmonary/Chest: Effort normal and breath sounds normal. No stridor. No respiratory distress. He has no wheezes. He has no rales. He exhibits no tenderness.  Abdominal: Soft. Bowel sounds are normal. There is no tenderness. There is no rebound and no guarding.  Musculoskeletal: Normal range of motion. He exhibits no edema or tenderness.  Neurological: He is alert.  Patient is noticed to move all extremities.  When asked specifically to move certain extremities he does not.  He is awake and intermittently answering questions.  Skin: Skin is warm and dry. Capillary refill takes less than 2 seconds. No rash noted. No erythema.  Nursing note and vitals reviewed.    ED Treatments / Results  Labs (all labs ordered are listed, but only abnormal results are displayed) Labs Reviewed  GLUCOSE, CAPILLARY - Abnormal; Notable for the following components:      Result Value   Glucose-Capillary 102 (*)    All other components within normal limits  COMPREHENSIVE METABOLIC PANEL - Abnormal; Notable for the following components:   BUN 25 (*)    Creatinine, Ser 1.53 (*)    GFR calc non Af Amer 50 (*)    GFR calc Af Amer 58 (*)    All other components within normal limits  ETHANOL  RAPID URINE DRUG SCREEN, HOSP PERFORMED  CBC WITH DIFFERENTIAL/PLATELET    EKG  EKG Interpretation None       Radiology Ct Head Wo Contrast  Result Date: 11/26/2017 CLINICAL DATA:  Unresponsive with nonreactive pupils. Questionable fall EXAM: CT HEAD WITHOUT CONTRAST CT CERVICAL SPINE WITHOUT CONTRAST TECHNIQUE: Multidetector CT imaging of the head and cervical spine was performed following the standard protocol without intravenous contrast. Multiplanar CT image reconstructions of the cervical spine were also generated. COMPARISON:  Brain MRI September 01, 2016 FINDINGS: CT HEAD FINDINGS Brain: Ventricles and sulci are upper normal for age with ventricular configuration normal. There is no intracranial mass,  hemorrhage, extra-axial fluid collection, or midline shift. There is minimal small vessel disease in the centra semiovale bilaterally. There are symmetric calcifications in each basal ganglia region. Elsewhere gray-white compartments appear normal. No evident acute infarct. Vascular: No hyperdense vessel.  No vascular calcification evident. Skull: Bony calvarium appears intact. Sinuses/Orbits: Visualized paranasal sinuses are clear. Orbits appear symmetric bilaterally. Other: Mild opacification of several mastoid air cells noted. No air-fluid levels. CT CERVICAL SPINE FINDINGS Alignment: There is no appreciable spondylolisthesis. 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 This area does not represent an acute injury. The borders of this nonunion or well corticated. Craniocervical junction and skull base regions otherwise appear unremarkable. No acute fracture is evident. There are no blastic or lytic bone lesions. Soft tissues and spinal canal: Prevertebral soft tissues and predental space regions are normal.  There are no paraspinous lesions. There is no appreciable cord or canal hematoma. 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 There is moderate disc space narrowing at C3-4, C6-7, and C7-T1. There is facet hypertrophy at multiple levels. There is exit foraminal narrowing due to bony hypertrophy on the left at C3-4 and at C6-7 bilaterally. No disc extrusion or stenosis evident. Upper chest: Visualized upper lung zones are clear. Other: There is calcification in the left carotid artery. IMPRESSION: CT head: Minimal periventricular small vessel disease. Mild basal ganglia calcification is likely physiologic in this age group. No acute infarct evident. No mass or hemorrhage. There is mild mastoid disease bilaterally. CT cervical spine: No fracture or spondylolisthesis. Foci of osteoarthritic change at several levels. 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 Calcification noted in the left carotid artery. Electronically Signed   By: Bretta BangWilliam  Woodruff III M.D.   On: 11/26/2017 14:44   Ct Cervical Spine Wo Contrast  Result Date: 11/26/2017 CLINICAL DATA:  Unresponsive with nonreactive pupils. Questionable fall EXAM: CT HEAD WITHOUT CONTRAST CT CERVICAL SPINE WITHOUT CONTRAST TECHNIQUE: Multidetector CT imaging of the head and cervical spine was performed following the standard protocol without intravenous contrast. Multiplanar CT image reconstructions of the cervical spine were also generated. COMPARISON:  Brain MRI September 01, 2016 FINDINGS: CT HEAD FINDINGS Brain: Ventricles and sulci are upper normal for age with ventricular configuration normal. There is no intracranial mass, hemorrhage, extra-axial fluid collection, or midline shift. There is minimal small vessel disease in the centra semiovale bilaterally. There are symmetric calcifications in each basal ganglia region. Elsewhere gray-white compartments appear normal. No evident acute infarct. Vascular: No hyperdense vessel.  No vascular calcification evident. Skull: Bony calvarium appears intact. Sinuses/Orbits: Visualized paranasal sinuses are clear. Orbits appear symmetric bilaterally. Other: Mild opacification of several mastoid air cells noted. No air-fluid levels. CT CERVICAL SPINE FINDINGS Alignment: There is no appreciable spondylolisthesis. 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 This area does not represent an acute injury. The borders of this nonunion or well corticated. Craniocervical junction and skull base regions otherwise appear unremarkable. No acute fracture is evident. There are no blastic or lytic bone lesions. Soft tissues and spinal canal: Prevertebral soft tissues and predental space regions are normal. There are no paraspinous lesions. There is no  appreciable cord or canal hematoma. 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 There is moderate disc space narrowing at C3-4, C6-7, and C7-T1. There is facet hypertrophy at multiple levels. There is exit foraminal narrowing due to bony hypertrophy on the left at C3-4 and at C6-7 bilaterally. No disc extrusion or stenosis evident. Upper chest: Visualized upper lung zones are clear. Other: There is calcification in the left carotid artery. IMPRESSION: CT head: Minimal periventricular small vessel disease. Mild basal ganglia calcification is likely physiologic in this age group. No acute infarct evident. No mass or hemorrhage. There is mild mastoid disease bilaterally. CT cervical spine: No fracture or spondylolisthesis. Foci of osteoarthritic change at several levels. 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 Calcification noted in the left carotid artery. Electronically Signed   By: Bretta BangWilliam  Woodruff III M.D.   On: 11/26/2017 14:44    Procedures Procedures (including critical care time)  Medications Ordered in ED Medications  alum & mag hydroxide-simeth (MAALOX/MYLANTA) 200-200-20 MG/5ML suspension 30 mL (not administered)  ondansetron (  ZOFRAN) tablet 4 mg (not administered)  acetaminophen (TYLENOL) tablet 325 mg (not administered)  aspirin EC tablet 81 mg (81 mg Oral Given 11/25/17 2115)  atorvastatin (LIPITOR) tablet 40 mg (40 mg Oral Given 11/25/17 2115)  citalopram (CELEXA) tablet 20 mg (20 mg Oral Given 11/26/17 0814)  docusate sodium (COLACE) capsule 100 mg (not administered)  hydrOXYzine (ATARAX/VISTARIL) tablet 25 mg (25 mg Oral Given 11/25/17 2115)  levothyroxine (SYNTHROID, LEVOTHROID) tablet 112 mcg (112 mcg Oral Given 11/26/17 0616)  mirtazapine (REMERON) tablet 7.5 mg (not administered)  risperiDONE (RISPERDAL) tablet 1 mg (not administered)     Initial Impression / Assessment and Plan / ED Course  I have  reviewed the triage vital signs and the nursing notes.  Pertinent labs & imaging results that were available during my care of the patient were reviewed by me and considered in my medical decision making (see chart for details).     Presentation consistent with malingering.  Also possible patient is having absence like seizures.   Patient without following commands.  Moving all extremities without any deficit.  Answering questions appropriately.  States had an episode like this while he was in [place].  Will discuss with neurology regarding any further testing.  Discussed with Dr. Amada Jupiter.  Recommends outpatient workup including EEG and MRI but does not believe this is emergent.  Given inconclusive presentation would not start on any type of antiepileptic at this point.  Will clear to go back to be a change. Final Clinical Impressions(s) / ED Diagnoses   Final diagnoses:  Episode of unresponsiveness    ED Discharge Orders    None       Loren Racer, MD 11/26/17 1600

## 2017-11-26 NOTE — ED Notes (Signed)
Bed: WA03 Expected date:  Expected time:  Means of arrival:  Comments: EMS-unresponsive 

## 2017-11-26 NOTE — Tx Team (Signed)
Interdisciplinary Treatment and Diagnostic Plan Update  11/26/2017 Time of Session: [place] MRN: [id number]  Principal Diagnosis: MDD, recurrent, severe, without psychosis  Secondary Diagnoses: Active Problems:   MDD (major depressive disorder), recurrent severe, without psychosis ([place])   Current Medications:  Current Facility-Administered Medications  Medication Dose Route Frequency Provider Last Rate Last Dose  . acetaminophen (TYLENOL) tablet 325 mg  325 mg Oral Q6H PRN Okonkwo, Justina A, NP      . alum & mag hydroxide-simeth (MAALOX/MYLANTA) 200-200-20 MG/5ML suspension 30 mL  30 mL Oral Q6H PRN Okonkwo, Justina A, NP      . aspirin EC tablet 81 mg  81 mg Oral QHS Okonkwo, Justina A, NP   81 mg at 11/25/17 2115  . atorvastatin (LIPITOR) tablet 40 mg  40 mg Oral QHS Okonkwo, Justina A, NP   40 mg at 11/25/17 2115  . citalopram (CELEXA) tablet 20 mg  20 mg Oral Daily Okonkwo, Justina A, NP   20 mg at 11/26/17 0814  . docusate sodium (COLACE) capsule 100 mg  100 mg Oral Daily PRN Okonkwo, Justina A, NP      . hydrOXYzine (ATARAX/VISTARIL) tablet 25 mg  25 mg Oral Q6H PRN Okonkwo, Justina A, NP   25 mg at 11/25/17 2115  . levothyroxine (SYNTHROID, LEVOTHROID) tablet 112 mcg  112 mcg Oral QAC breakfast Okonkwo, Justina A, NP   112 mcg at 11/26/17 0616  . mirtazapine (REMERON) tablet 7.5 mg  7.5 mg Oral QHS Starkes, Takia S, FNP      . ondansetron (ZOFRAN) tablet 4 mg  4 mg Oral Q8H PRN Okonkwo, Justina A, NP      . risperiDONE (RISPERDAL) tablet 1 mg  1 mg Oral QHS Cobos, Myer Peer, MD       PTA Medications: Medications Prior to Admission  Medication Sig Dispense Refill Last Dose  . acetaminophen (TYLENOL) 325 MG tablet Take 1 tablet (325 mg total) by mouth every 6 (six) hours as needed (for pain or headaches). (Patient not taking: Reported on 11/22/2017)   Not Taking at Unknown time  . acetaminophen (TYLENOL) 500 MG tablet Take 1,000 mg by mouth every 6 (six) hours as  needed for headache (pain).   month ago  . aspirin EC 81 MG tablet Take 1 tablet (81 mg total) by mouth at bedtime. For heart health   11/20/2017 at pm  . atorvastatin (LIPITOR) 40 MG tablet Take 1 tablet (40 mg total) by mouth at bedtime. For high Cholesterol   11/20/2017 at pm  . busPIRone (BUSPAR) 10 MG tablet Take 10 mg by mouth 3 (three) times daily with meals.   11/21/2017 at lunch  . citalopram (CELEXA) 40 MG tablet Take 40 mg by mouth daily.   11/21/2017 at am  . docusate sodium (COLACE) 100 MG capsule Take 1 capsule (100 mg total) by mouth daily as needed for mild constipation. 10 capsule 0 2 weeks ago  . famotidine (PEPCID) 20 MG tablet Take 20 mg by mouth daily.   11/21/2017 at am  . hydrOXYzine (ATARAX/VISTARIL) 25 MG tablet Take 1 tablet (25 mg total) by mouth every 6 (six) hours as needed for anxiety. 60 tablet 0 never taken  . levothyroxine (SYNTHROID, LEVOTHROID) 112 MCG tablet Take 1 tablet (112 mcg total) by mouth daily before breakfast. For hypothyroidism   11/21/2017 at am  . risperiDONE (RISPERDAL) 0.5 MG tablet Take 0.5 mg by mouth at bedtime.   11/20/2017 at pm  . traZODone (DESYREL)  50 MG tablet Take 1 tablet (50 mg total) by mouth at bedtime as needed for sleep. (Patient not taking: Reported on 11/22/2017) 30 tablet 0 Not Taking at Unknown time    Patient Stressors: Marital or family conflict  Patient Strengths: Physical Health Supportive family/friends  Treatment Modalities: Medication Management, Group therapy, Case management,  1 to 1 session with clinician, Psychoeducation, Recreational therapy.   Physician Treatment Plan for Primary Diagnosis: MDD, recurrent, severe, without psychosis Long Term Goal(s): Improvement in symptoms so as ready for discharge Improvement in symptoms so as ready for discharge   Short Term Goals: Ability to identify changes in lifestyle to reduce recurrence of condition will improve Ability to verbalize feelings will improve Ability to  disclose and discuss suicidal ideas Ability to demonstrate self-control will improve Ability to identify and develop effective coping behaviors will improve Ability to maintain clinical measurements within normal limits will improve Compliance with prescribed medications will improve Ability to identify triggers associated with substance abuse/mental health issues will improve  Medication Management: Evaluate patient's response, side effects, and tolerance of medication regimen.  Therapeutic Interventions: 1 to 1 sessions, Unit Group sessions and Medication administration.  Evaluation of Outcomes: Not Met  Physician Treatment Plan for Secondary Diagnosis: Active Problems:   MDD (major depressive disorder), recurrent severe, without psychosis ([place])  Long Term Goal(s): Improvement in symptoms so as ready for discharge Improvement in symptoms so as ready for discharge   Short Term Goals: Ability to identify changes in lifestyle to reduce recurrence of condition will improve Ability to verbalize feelings will improve Ability to disclose and discuss suicidal ideas Ability to demonstrate self-control will improve Ability to identify and develop effective coping behaviors will improve Ability to maintain clinical measurements within normal limits will improve Compliance with prescribed medications will improve Ability to identify triggers associated with substance abuse/mental health issues will improve     Medication Management: Evaluate patient's response, side effects, and tolerance of medication regimen.  Therapeutic Interventions: 1 to 1 sessions, Unit Group sessions and Medication administration.  Evaluation of Outcomes: Not Met   RN Treatment Plan for Primary Diagnosis: MDD, recurrent, severe, without psychosis Long Term Goal(s): Knowledge of disease and therapeutic regimen to maintain health will improve  Short Term Goals: Ability to remain free from injury will improve,  Ability to disclose and discuss suicidal ideas and Ability to identify and develop effective coping behaviors will improve  Medication Management: RN will administer medications as ordered by provider, will assess and evaluate patient's response and provide education to patient for prescribed medication. RN will report any adverse and/or side effects to prescribing provider.  Therapeutic Interventions: 1 on 1 counseling sessions, Psychoeducation, Medication administration, Evaluate responses to treatment, Monitor vital signs and CBGs as ordered, Perform/monitor CIWA, COWS, AIMS and Fall Risk screenings as ordered, Perform wound care treatments as ordered.  Evaluation of Outcomes: Not Met   LCSW Treatment Plan for Primary Diagnosis: MDD, recurrent, severe, without psychosis Long Term Goal(s): Safe transition to appropriate next level of care at discharge, Engage patient in therapeutic group addressing interpersonal concerns.  Short Term Goals: Engage patient in aftercare planning with referrals and resources, Facilitate patient progression through stages of change regarding substance use diagnoses and concerns and Identify triggers associated with mental health/substance abuse issues  Therapeutic Interventions: Assess for all discharge needs, 1 to 1 time with Social worker, Explore available resources and support systems, Assess for adequacy in community support network, Educate family and significant other(s) on suicide prevention, Complete  Psychosocial Assessment, Interpersonal group therapy.  Evaluation of Outcomes: Not Met   Progress in Treatment: Attending groups: No.  New to unit. Continuing to assess. Participating in groups: No. Taking medication as prescribed: Yes. Toleration medication: Yes. Family/Significant other contact made: No, will contact:  family member if patient consents Patient understands diagnosis: Yes. Discussing patient identified problems/goals with staff:  Yes. Medical problems stabilized or resolved: Yes. Denies suicidal/homicidal ideation: Yes. Issues/concerns per patient self-inventory: No. Other: n/a  New problem(s) identified: No, Describe:  n/a  New Short Term/Long Term Goal(s): medication management for mood stabilization, development of comprehensive mental wellness plan, elimination of SI thoughts.   Discharge Plan or Barriers: CSW assessing. Pt lives with wife and mother. Pt [facility]/2017 and was [facility] to [facility] for [facility].   Reason for Continuation of Hospitalization: Anxiety Depression Medication stabilization Suicidal ideation Withdrawal symptoms  Estimated Length of Stay: Monday, 12/01/17  Attendees: Patient: 11/26/2017 10:56 AM  Physician: Dr. Parke Poisson MD; Dr. Nancy Fetter MD 11/26/2017 10:56 AM  Nursing: Jan RN 11/26/2017 10:56 AM  RN Care Manager:x 11/26/2017 10:56 AM  Social Worker: Press photographer, LCSW 11/26/2017 10:56 AM  Recreational Therapist: x 11/26/2017 10:56 AM  Other: Lindell Spar NP; Darnelle Maffucci Money NP 11/26/2017 10:56 AM  Other:  11/26/2017 10:56 AM  Other: 11/26/2017 10:56 AM    Scribe for Treatment Team: Ambia, LCSW 11/26/2017 10:56 AM

## 2017-11-26 NOTE — ED Triage Notes (Signed)
[facility] Pt was found not talking or responding to the nurses. EMS states pt was alert and looking around on scene. Pt was moving his arms and was talking.

## 2017-11-27 DIAGNOSIS — F39 Unspecified mood [affective] disorder: Secondary | ICD-10-CM

## 2017-11-27 DIAGNOSIS — R45851 Suicidal ideations: Secondary | ICD-10-CM

## 2017-11-27 DIAGNOSIS — Z87891 Personal history of nicotine dependence: Secondary | ICD-10-CM

## 2017-11-27 LAB — LIPID PANEL
CHOL/HDL RATIO: 5.7 ratio
Cholesterol: 154 mg/dL (ref 0–200)
HDL: 27 mg/dL — AB (ref >40)
LDL CALC: 86 mg/dL (ref 0–99)
TRIGLYCERIDES: 206 mg/dL — AB (ref <150)
VLDL: 41 mg/dL — AB (ref 0–40)

## 2017-11-27 LAB — HEMOGLOBIN A1C
Hgb A1c MFr Bld: 5.7 % — ABNORMAL HIGH (ref 4.8–5.6)
Mean Plasma Glucose: 116.89 mg/dL

## 2017-11-27 NOTE — Progress Notes (Signed)
[facility] MD Progress Note  11/27/2017 3:09 PM Devin Joseph  MRN:  [id number]   Subjective:  Patient reports that he is still suicidal over not getting the Xbox he wants. He reports that he has been married to the same woman for 13 years and she has always tried to be controlling. "she won't l;et me buy it and I have the money for it." patient reported that he would have $100 after buying the Xbox and he would be fine. He states that he may even divorce her and may not even go back there to live after discharge. He denies HI/AVH and contracts for safety.   Objective: Patient's chart and findings reviewed and discussed with treatment team. Patient is lying in bed and makes minimal eye contact. His behavior is child like as he is threatening suicide if he doesn't get his Xbox. Will discuss with CSW about discharge disposition as he may go to a shelter if he refuses to go back home with his wife and mother. Patient informed of possible shelter placement.  Principal Problem: MDD (major depressive disorder), recurrent severe, without psychosis ([place]) Diagnosis:   Patient Active Problem List   Diagnosis Date Noted  . MDD (major depressive disorder), recurrent severe, without psychosis ([place]) [F33.2] 11/25/2017  . Severe recurrent major depression without psychotic features ([place]) [F33.2] 09/10/2017   Total Time spent with patient: 15 minutes  Past Psychiatric History: See H&P  Past Medical History:  Past Medical History:  Diagnosis Date  . Depression   . Suicidal behavior without attempted self-injury   . Thyroid disease     Past Surgical History:  Procedure Laterality Date  . CHOLECYSTECTOMY    . HERNIA REPAIR     Family History: History reviewed. No pertinent family history. Family Psychiatric  History: See H&P Social History:  Social History   Substance and Sexual Activity  Alcohol Use No     Social History   Substance and Sexual Activity  Drug Use No    Social History    Socioeconomic History  . Marital status: Single    Spouse name: None  . Number of children: None  . Years of education: None  . Highest education level: None  Social Needs  . Financial resource strain: None  . Food insecurity - worry: None  . Food insecurity - inability: None  . Transportation needs - medical: None  . Transportation needs - non-medical: None  Occupational History  . None  Tobacco Use  . Smoking status: Former Research scientist (life sciences)  . Smokeless tobacco: Never Used  Substance and Sexual Activity  . Alcohol use: No  . Drug use: No  . Sexual activity: None  Other Topics Concern  . None  Social History Narrative  . 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                    Sleep: Good  Appetite:  Good  Current Medications: Current Facility-Administered Medications  Medication Dose Route Frequency Provider Last Rate Last Dose  . acetaminophen (TYLENOL) tablet 325 mg  325 mg Oral Q6H PRN Okonkwo, Justina A, NP      . alum & mag hydroxide-simeth (MAALOX/MYLANTA) 200-200-20 MG/5ML suspension 30 mL  30 mL Oral Q6H PRN Okonkwo, Justina A, NP      .  aspirin EC tablet 81 mg  81 mg Oral QHS Okonkwo, Justina A, NP   81 mg at 11/26/17 2123  . atorvastatin (LIPITOR) tablet 40 mg  40 mg Oral QHS Okonkwo, Justina A, NP   40 mg at 11/26/17 2123  . citalopram (CELEXA) tablet 20 mg  20 mg Oral Daily Okonkwo, Justina A, NP   20 mg at 11/27/17 0818  . docusate sodium (COLACE) capsule 100 mg  100 mg Oral Daily PRN Okonkwo, Justina A, NP      . hydrOXYzine (ATARAX/VISTARIL) tablet 25 mg  25 mg Oral Q6H PRN Okonkwo, Justina A, NP   25 mg at 11/25/17 2115  . levothyroxine (SYNTHROID, LEVOTHROID) tablet 112 mcg  112 mcg Oral QAC breakfast Okonkwo, Justina A, NP   112 mcg at  11/27/17 0637  . mirtazapine (REMERON) tablet 7.5 mg  7.5 mg Oral QHS Nanci Pina, FNP   7.5 mg at 11/26/17 2123  . ondansetron (ZOFRAN) tablet 4 mg  4 mg Oral Q8H PRN Okonkwo, Justina A, NP      . risperiDONE (RISPERDAL) tablet 1 mg  1 mg Oral QHS Emy Angevine, Myer Peer, MD   1 mg at 11/26/17 2123    Lab Results:  Results for orders placed or performed during the hospital encounter of 11/25/17 (from the past 48 hour(s))  Glucose, capillary     Status: Abnormal   Collection Time: 11/26/17 12:26 PM  Result Value Ref Range   Glucose-Capillary 102 (H) 65 - 99 mg/dL  Rapid urine drug screen (hospital performed)     Status: None   Collection Time: 11/26/17  1:03 PM  Result Value Ref Range   Opiates NONE DETECTED NONE DETECTED   Cocaine NONE DETECTED NONE DETECTED   Benzodiazepines NONE DETECTED NONE DETECTED   Amphetamines NONE DETECTED NONE DETECTED   Tetrahydrocannabinol NONE DETECTED NONE DETECTED   Barbiturates NONE DETECTED NONE DETECTED    Comment:        DRUG SCREEN FOR MEDICAL PURPOSES ONLY.  IF CONFIRMATION IS NEEDED FOR ANY PURPOSE, NOTIFY LAB WITHIN 5 DAYS.        LOWEST DETECTABLE LIMITS FOR URINE DRUG SCREEN Drug Class       Cutoff (ng/mL) Amphetamine      1000 Barbiturate      200 Benzodiazepine   353 Tricyclics       299 Opiates          300 Cocaine          300 THC              50   Ethanol     Status: None   Collection Time: 11/26/17  2:48 PM  Result Value Ref Range   Alcohol, Ethyl (B) <10 <10 mg/dL    Comment:        LOWEST DETECTABLE LIMIT FOR SERUM ALCOHOL IS 10 mg/dL FOR MEDICAL PURPOSES ONLY   CBC with Differential/Platelet     Status: None   Collection Time: 11/26/17  2:48 PM  Result Value Ref Range   WBC 7.9 4.0 - 10.5 K/uL   RBC 5.40 4.22 - 5.81 MIL/uL   Hemoglobin 16.4 13.0 - 17.0 g/dL   HCT 47.9 39.0 - 52.0 %   MCV 88.7 78.0 - 100.0 fL   MCH 30.4 26.0 - 34.0 pg   MCHC 34.2 30.0 - 36.0 g/dL   RDW 13.8 11.5 - 15.5 %   Platelets 162 150 -  400 K/uL   Neutrophils Relative %  51 %   Neutro Abs 4.1 1.7 - 7.7 K/uL   Lymphocytes Relative 34 %   Lymphs Abs 2.7 0.7 - 4.0 K/uL   Monocytes Relative 11 %   Monocytes Absolute 0.9 0.1 - 1.0 K/uL   Eosinophils Relative 4 %   Eosinophils Absolute 0.3 0.0 - 0.7 K/uL   Basophils Relative 0 %   Basophils Absolute 0.0 0.0 - 0.1 K/uL  Comprehensive metabolic panel     Status: Abnormal   Collection Time: 11/26/17  2:48 PM  Result Value Ref Range   Sodium 142 135 - 145 mmol/L   Potassium 4.1 3.5 - 5.1 mmol/L   Chloride 107 101 - 111 mmol/L   CO2 24 22 - 32 mmol/L   Glucose, Bld 98 65 - 99 mg/dL   BUN 25 (H) 6 - 20 mg/dL   Creatinine, Ser 1.53 (H) 0.61 - 1.24 mg/dL   Calcium 9.9 8.9 - 10.3 mg/dL   Total Protein 8.1 6.5 - 8.1 g/dL   Albumin 4.7 3.5 - 5.0 g/dL   AST 24 15 - 41 U/L   ALT 22 17 - 63 U/L   Alkaline Phosphatase 111 38 - 126 U/L   Total Bilirubin 1.0 0.3 - 1.2 mg/dL   GFR calc non Af Amer 50 (L) >60 mL/min   GFR calc Af Amer 58 (L) >60 mL/min    Comment: (NOTE) The eGFR has been calculated using the CKD EPI equation. This calculation has not been validated in all clinical situations. eGFR's persistently <60 mL/min signify possible Chronic Kidney Disease.    Anion gap 11 5 - 15  Hemoglobin A1c     Status: Abnormal   Collection Time: 11/27/17  7:00 AM  Result Value Ref Range   Hgb A1c MFr Bld 5.7 (H) 4.8 - 5.6 %    Comment: (NOTE) Pre diabetes:          5.7%-6.4% Diabetes:              >6.4% Glycemic control for   <7.0% adults with diabetes    Mean Plasma Glucose 116.89 mg/dL    Comment: Performed at [facility] [address], [place], [place] [address]  Lipid panel     Status: Abnormal   Collection Time: 11/27/17  7:00 AM  Result Value Ref Range   Cholesterol 154 0 - 200 mg/dL   Triglycerides 206 (H) <150 mg/dL   HDL 27 (L) >40 mg/dL   Total CHOL/HDL Ratio 5.7 RATIO   VLDL 41 (H) 0 - 40 mg/dL   LDL Cholesterol 86 0 - 99 mg/dL    Comment:         Total Cholesterol/HDL:CHD Risk Coronary Heart Disease Risk Table                     Men   Women  1/2 Average Risk   3.4   3.3  Average Risk       5.0   4.4  2 X Average Risk   9.6   7.1  3 X Average Risk  23.4   11.0        Use the calculated Patient Ratio above and the CHD Risk Table to determine the patient's CHD Risk.        ATP III CLASSIFICATION (LDL):  <100     mg/dL   Optimal  100-129  mg/dL   Near or Above  Optimal  130-159  mg/dL   Borderline  160-189  mg/dL   High  >190     mg/dL   Very High Performed at [facility] [address] [place], [place] [address]     Blood Alcohol level:  Lab Results  Component Value Date   [facility] <10 11/26/2017   ETH <10 90/24/0973    Metabolic Disorder Labs: Lab Results  Component Value Date   HGBA1C 5.7 (H) 11/27/2017   MPG 116.89 11/27/2017   No results found for: PROLACTIN Lab Results  Component Value Date   CHOL 154 11/27/2017   TRIG 206 (H) 11/27/2017   HDL 27 (L) 11/27/2017   CHOLHDL 5.7 11/27/2017   VLDL 41 (H) 11/27/2017   LDLCALC 86 11/27/2017    Physical Findings: AIMS: Facial and Oral Movements Muscles of Facial Expression: None, normal Lips and Perioral Area: None, normal Jaw: None, normal Tongue: None, normal,Extremity Movements Upper (arms, wrists, hands, fingers): None, normal Lower (legs, knees, ankles, toes): None, normal, Trunk Movements Neck, shoulders, hips: None, normal, Overall Severity Severity of abnormal movements (highest score from questions above): None, normal Incapacitation due to abnormal movements: None, normal Patient's awareness of abnormal movements (rate only patient's report): No Awareness, Dental Status Current problems with teeth and/or dentures?: No Does patient usually wear dentures?: No  CIWA:    COWS:     Musculoskeletal: Strength & Muscle Tone: within normal limits Gait & Station: normal Patient leans: N/A  Psychiatric Specialty  Exam: Physical Exam  Nursing note and vitals reviewed. Constitutional: He is oriented to person, place, and time. He appears well-developed and well-nourished.  Cardiovascular: Normal rate.  Respiratory: Effort normal.  Musculoskeletal: Normal range of motion.  Neurological: He is alert and oriented to person, place, and time.  Skin: Skin is warm.    Review of Systems  Constitutional: Negative.   HENT: Negative.   Eyes: Negative.   Respiratory: Negative.   Cardiovascular: Negative.   Gastrointestinal: Negative.   Genitourinary: Negative.   Musculoskeletal: Negative.   Skin: Negative.   Neurological: Negative.   Endo/Heme/Allergies: Negative.   Psychiatric/Behavioral: Positive for depression and suicidal ideas (passive). Negative for hallucinations.    Blood pressure 101/87, pulse 89, temperature 97.9 F (36.6 C), resp. rate 18, height 5' 4"  (1.626 m), weight 82.6 kg (182 lb), SpO2 96 %.Body mass index is 31.24 kg/m.  General Appearance: Casual  Eye Contact:  Minimal  Speech:  Clear and Coherent and Normal Rate  Volume:  Normal  Mood:  Depressed  Affect:  Flat  Thought Process:  Goal Directed and Descriptions of Associations: Intact  Orientation:  Full (Time, Place, and Person)  Thought Content:  WDL  Suicidal Thoughts:  Yes.  without intent/plan  Homicidal Thoughts:  No  Memory:  Immediate;   Good Recent;   Good Remote;   Good  Judgement:  Fair  Insight:  Fair  Psychomotor Activity:  Normal  Concentration:  Concentration: Good and Attention Span: Good  Recall:  Good  Fund of Knowledge:  Good  Language:  Good  Akathisia:  No  Handed:  Right  AIMS (if indicated):     Assets:  Communication Skills Desire for Improvement Financial Resources/Insurance Housing Physical Health Social Support Transportation  ADL's:  Intact  Cognition:  WNL  Sleep:  Number of Hours: 6.75   Problems Addressed: MDD severe  Treatment Plan Summary: Daily contact with patient to  assess and evaluate symptoms and progress in treatment, Medication management and Plan is  to:  -Continue Celexa 20 mg PO Daily for mood stability -Continue Remeron 7.5 mg PO QHS for mood stability -Continue Risperidone 1 mg PO QHS for mood stability -Encourage group therapy participation   Lewis Shock, FNP 11/27/2017, 3:09 PM   Agree with NP Progress Note

## 2017-11-27 NOTE — BHH Counselor (Signed)
Adult Comprehensive Assessment  Patient ID: Devin Joseph, male   DOB: 1961-10-28, 56 y.o.   MRN: [id number]   Information Source: Information source: Patient  Current Stressors: Educational / Learning stressors: High school education  Employment / Job issues: On disability due to IDD Family Relationships: Pt has a 55 yo daughter whom he does not get to see as often as he would like and this is very distressing to him  Financial / Lack of resources (include bankruptcy): Limited income  Housing / Lack of housing: None reported  Physical health (include injuries &life threatening diseases): None reported  Social relationships: Conflictual relationship with his wife  Substance abuse: Alcohol use  Bereavement / Loss: None reported  Living/Environment/Situation: Living Arrangements: Spouse/significant other, Parent Living conditions (as described by patient or guardian): Pt lives with his wife and his mother  How long has patient lived in current situation?: "Our entire marriage" What is atmosphere in current home: Comfortable  Family History: Marital status: Married Number of Years Married: 12 What types of issues is patient dealing with in the relationship?: "We don't always get along. There's been points when I just want to tell her I'm done"  Childhood History: By whom was/is the patient raised?: Mother Additional childhood history information: Pt describes his childhood as "poor" Description of patient's relationship with caregiver when they were a child: "She's always been there when I need her" Patient's description of current relationship with people who raised him/her: Pt still has a close relaitonship with his mother  Does patient have siblings?: Yes Number of Siblings: 3 (2 brothers, 1 sister ) Description of patient's current relationship with siblings: "They always work all the time. They don't come to see me that often" Did patient suffer any  verbal/emotional/physical/sexual abuse as a child?: ("I don't know.Marland Kitchen.Marland Kitchen.I couldn't say that far back. My memory ain't that good") Did patient suffer from severe childhood neglect?: No Has patient ever been sexually abused/assaulted/raped as an adolescent or adult?: No Was the patient ever a victim of a crime or a disaster?: No Witnessed domestic violence?: No Has patient been effected by domestic violence as an adult?: No  Education: Highest grade of school patient has completed: High school  Currently a student?: No Learning disability?: Yes What learning problems does patient have?: Pt states that he always struggling with reading and math, pt also struggles with comprehension  Employment/Work Situation: Employment situation: On disability Why is patient on disability: IDD How long has patient been on disability: Since pt was 56 yo What is the longest time patient has a held a job?: 2 years  Where was the patient employed at that time?: [employer]  Has patient ever been in the [employer] and Companymilitary?: No Has patient ever served in combat?: No Did You Receive Any Psychiatric Treatment/Services While in Equities traderthe Military?: (NA) Are There Guns or Other Weapons in Your Home?: Yes Types of Guns/Weapons: Pt has access to knives. Pt stated "If I'm discharged I'm afraid I'm going to just go to the knife drawer and do it up" Are These Weapons Safely Secured?: No Who Could Verify You Are Able To Have These Secured:: Pt's wife will be called and asked to secure the knives  Financial Resources: Financial resources: [employer] SSDI  Alcohol/Substance Abuse: What has been your use of drugs/alcohol within the last 12 months?: Pt drinks whole cases of beer on the weekends  If attempted suicide, did drugs/alcohol play a role in this?: Yes Alcohol/Substance Abuse Treatment Hx: Denies past history Has  alcohol/substance abuse ever caused legal problems?: No  Social Support System: Patient's Community  Support System: Fair Describe Community Support System: "I don't have any.Marland Kitchen.Marland Kitchen.I just have a therapist I can call" Type of faith/religion: Baptist  How does patient's faith help to cope with current illness?: "It's not"  Leisure/Recreation: Leisure and Hobbies: Video games  Strengths/Needs: What things does the patient do well?: Taking care of his dogs  In what areas does patient struggle / problems for patient: "Trying to get better"  Discharge Plan: Does patient have access to transportation?: Yes (Pt's wife wil ltransport ) Will patient be returning to same living situation after discharge?: Yes Currently receiving community mental health services: Yes (From Whom) (Dr. Joni ReiningNicole (doesn't know any other information)) Does patient have financial barriers related to discharge medications?: Yes Patient description of barriers related to discharge medications: Limited income         Summary/Recommendations:   Summary and Recommendations (to be completed by the evaluator): Patient is 56yo [place] with his wife of 12 years and his mother. Patient presents to the hospital seeking treatment for increased depression/mood lability, SI with plan to shoot himself, and for medication stabilization. Patient currently endorses passive SI and denies HI/AVH. Pt denies substance use. He reports conflict with his wife and mother as his primary stressors. Patient's last admission was 09/10/17 with similar presentation. Patient has a primary diagnosis of MDD, recurrent, severe. Recommendations for patient include: crisis stabilization, therapeutic milieu, encourage group attendance and participation, medication management for mood stabilization, and development of comprehensive mental wellness plan. CSW assessing for appropriate referrals.   Ledell PeoplesHeather N Smart LCSW 11/27/2017 1:35 PM

## 2017-11-27 NOTE — Progress Notes (Signed)
Pt observed in the dayroom at the beginning of the shift watching TV and talking some with peers.  He was at the ED for part of the afternoon to be assessed d/t a catatonic episode according to the previous.  By the time the pt arrived to the ED, he had come out of the catatonic state, and was talking with ED staff.  All labs and imaging were negative.  Since returning to the unit, pt presents in a pleasant, bright mood.  He says he still has some passive suicidal thoughts, but contracts for safety.  He denies HI/AVH.   He is aware of the meds that are scheduled for tonight and had no questions at that time.  He is unsure about his discharge plans.  Support and encouragement offered.  Discharge plans are in process.  Safety maintained with q15 minute checks.

## 2017-11-27 NOTE — Progress Notes (Signed)
DAR NOTE: Pt present with flat affect and depressed mood in the unit. Pt has been observed in the day room interacting with peers, but when asked how he is doing, pt stated " I am irritable. " Pt denies physical pain, took all his meds as scheduled. As per self inventory, pt had a poor night sleep, poor appetite, high energy, and poor concentration. Pt rate depression at 10, hopeless ness at 10, and anxiety at 10. Pt's safety ensured with 15 minute and environmental checks. Pt currently denies SI/HI and A/V hallucinations. Pt verbally agrees to seek staff if SI/HI or A/VH occurs and to consult with staff before acting on these thoughts. Will continue POC.

## 2017-11-27 NOTE — Progress Notes (Signed)
Adult Psychoeducational Group Note  Date:  11/27/2017 Time:  4:49 PM  Group Topic/Focus:  Goals Group:   The focus of this group is to help patients establish daily goals to achieve during treatment and discuss how the patient can incorporate goal setting into their daily lives to aide in recovery.  Participation Level:  Did Not Attend  Participation Quality:    Affect:    Cognitive:    Insight: None  Engagement in Group:    Modes of Intervention:    Additional Comments:    Devin BeersRodney S Nawaal Joseph 11/27/2017, 4:49 PM

## 2017-11-28 MED ORDER — MIRTAZAPINE 15 MG PO TABS
15.0000 mg | ORAL_TABLET | Freq: Every day | ORAL | Status: DC
  Administered 2017-11-28 – 2017-11-29 (×2): 15 mg via ORAL
  Filled 2017-11-28 (×4): qty 1

## 2017-11-28 NOTE — Progress Notes (Signed)
D: After the introduction, writer asked pt about his day. Pt stated, "the next time I come back and stay til Christmas". Writer asked pt to explain. Pt seemed to think he's being considered for discharge soon, but said he wasn't sure. Stated, my "mom and wife are arguing about my present. I don"t want a present under the tree, but they got me one anyway". Pt has no questions or concerns.    A:  Support and encouragement was offered. 15 min checks continued for safety.  R: Pt remains safe.

## 2017-11-28 NOTE — Progress Notes (Signed)
[facility] MD Progress Note  11/28/2017 11:18 AM BRADAN CONGROVE  MRN:  [id number]   Subjective:  Patient denies suicidal ideations at this time, but states he continues to have suicidal ideations " sometimes", without plan or intention at present. He acknowledges he is feeling " a little better", compared to how he felt prior to admission. He remains ruminative about not being allowed to have the gaming system he is wanting . Denies medication side effects.  Objective:  I have discussed case with treatment team and have met with patient. Patient presents alert, attentive at this time, oriented . He recently had a brief episode of becoming non verbal, non responsive, which lasted several minutes and resolved spontaneously . He states he has had similar episodes , and that they have been occurring for months or possibly years . 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 He remains ruminative and focused  on specific, concrete/childish  Issues, mainly not being able to get the video game system he wants because his mother and his wife do not agree. States " all I want is that system, and I will never need another one ". In general , he presents with concrete thinking pattern, limited ability for abstraction or elaboration of his stressors and how they impact his emotions . Reports ongoing anger and depression, which he attributes to above . Describes partially improved mood, but states " I am still sad ". Describes intermittent passive SI , but denies any plan or intention and contracts for safety on unit, denies homicidal ideations towards family or anyone else . Denies medication side effects.    Principal Problem: MDD (major depressive disorder), recurrent severe, without psychosis ([place]) Diagnosis:   Patient Active Problem List   Diagnosis Date Noted  . MDD (major depressive disorder), recurrent severe, without psychosis ([place]) [F33.2] 11/25/2017  . Severe recurrent major depression  without psychotic features ([place]) [F33.2] 09/10/2017   Total Time spent with patient: 20 minutes  Past Psychiatric History: See H&P  Past Medical History:  Past Medical History:  Diagnosis Date  . Depression   . Suicidal behavior without attempted self-injury   . Thyroid disease     Past Surgical History:  Procedure Laterality Date  . CHOLECYSTECTOMY    . HERNIA REPAIR     Family History: History reviewed. No pertinent family history. Family Psychiatric  History: See H&P Social History:  Social History   Substance and Sexual Activity  Alcohol Use No     Social History   Substance and Sexual Activity  Drug Use No    Social History   Socioeconomic History  . Marital status: Single    Spouse name: None  . Number of children: None  . Years of education: None  . Highest education level: None  Social Needs  . Financial resource strain: None  . Food insecurity - worry: None  . Food insecurity - inability: None  . Transportation needs - medical: None  . Transportation needs - non-medical: None  Occupational History  . None  Tobacco Use  . Smoking status: Former Research scientist (life sciences)  . Smokeless tobacco: Never Used  Substance and Sexual Activity  . Alcohol use: No  . Drug use: No  . Sexual activity: None  Other Topics Concern  . None  Social History Narrative  . 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  Sleep: Good  Appetite:  Good  Current Medications: Current Facility-Administered Medications  Medication Dose Route Frequency Provider Last Rate Last Dose  . acetaminophen (TYLENOL) tablet 325 mg  325 mg Oral Q6H PRN Okonkwo, Justina A, NP      . alum & mag hydroxide-simeth (MAALOX/MYLANTA) 200-200-20 MG/5ML suspension 30 mL  30 mL Oral Q6H PRN Okonkwo, Justina A, NP       . aspirin EC tablet 81 mg  81 mg Oral QHS Okonkwo, Justina A, NP   81 mg at 11/27/17 2057  . atorvastatin (LIPITOR) tablet 40 mg  40 mg Oral QHS Okonkwo, Justina A, NP   40 mg at 11/27/17 2057  . citalopram (CELEXA) tablet 20 mg  20 mg Oral Daily Okonkwo, Justina A, NP   20 mg at 11/28/17 0755  . docusate sodium (COLACE) capsule 100 mg  100 mg Oral Daily PRN Okonkwo, Justina A, NP      . hydrOXYzine (ATARAX/VISTARIL) tablet 25 mg  25 mg Oral Q6H PRN Okonkwo, Justina A, NP   25 mg at 11/28/17 0745  . levothyroxine (SYNTHROID, LEVOTHROID) tablet 112 mcg  112 mcg Oral QAC breakfast Okonkwo, Justina A, NP   112 mcg at 11/28/17 0645  . mirtazapine (REMERON) tablet 7.5 mg  7.5 mg Oral QHS Priscille Loveless S, FNP   7.5 mg at 11/27/17 2057  . ondansetron (ZOFRAN) tablet 4 mg  4 mg Oral Q8H PRN Okonkwo, Justina A, NP      . risperiDONE (RISPERDAL) tablet 1 mg  1 mg Oral QHS Cobos, Myer Peer, MD   1 mg at 11/27/17 2057    Lab Results:  Results for orders placed or performed during the hospital encounter of 11/25/17 (from the past 48 hour(s))  Glucose, capillary     Status: Abnormal   Collection Time: 11/26/17 12:26 PM  Result Value Ref Range   Glucose-Capillary 102 (H) 65 - 99 mg/dL  Rapid urine drug screen (hospital performed)     Status: None   Collection Time: 11/26/17  1:03 PM  Result Value Ref Range   Opiates NONE DETECTED NONE DETECTED   Cocaine NONE DETECTED NONE DETECTED   Benzodiazepines NONE DETECTED NONE DETECTED   Amphetamines NONE DETECTED NONE DETECTED   Tetrahydrocannabinol NONE DETECTED NONE DETECTED   Barbiturates NONE DETECTED NONE DETECTED    Comment:        DRUG SCREEN FOR MEDICAL PURPOSES ONLY.  IF CONFIRMATION IS NEEDED FOR ANY PURPOSE, NOTIFY LAB WITHIN 5 DAYS.        LOWEST DETECTABLE LIMITS FOR URINE DRUG SCREEN Drug Class       Cutoff (ng/mL) Amphetamine      1000 Barbiturate      200 Benzodiazepine   732 Tricyclics       202 Opiates          300 Cocaine           300 THC              50   Ethanol     Status: None   Collection Time: 11/26/17  2:48 PM  Result Value Ref Range   Alcohol, Ethyl (B) <10 <10 mg/dL    Comment:        LOWEST DETECTABLE LIMIT FOR SERUM ALCOHOL IS 10 mg/dL FOR MEDICAL PURPOSES ONLY   CBC with Differential/Platelet     Status: None   Collection Time: 11/26/17  2:48 PM  Result Value Ref Range   WBC 7.9 4.0 - 10.5 K/uL   RBC 5.40 4.22 - 5.81 MIL/uL   Hemoglobin 16.4 13.0 - 17.0 g/dL   HCT 47.9 39.0 - 52.0 %   MCV 88.7 78.0 - 100.0 fL   MCH 30.4 26.0 - 34.0 pg   MCHC 34.2 30.0 - 36.0 g/dL   RDW 13.8 11.5 - 15.5 %   Platelets 162 150 - 400 K/uL   Neutrophils Relative % 51 %   Neutro Abs 4.1 1.7 - 7.7 K/uL   Lymphocytes Relative 34 %   Lymphs Abs 2.7 0.7 - 4.0 K/uL   Monocytes Relative 11 %   Monocytes Absolute 0.9 0.1 - 1.0 K/uL   Eosinophils Relative 4 %   Eosinophils Absolute 0.3 0.0 - 0.7 K/uL   Basophils Relative 0 %   Basophils Absolute 0.0 0.0 - 0.1 K/uL  Comprehensive metabolic panel     Status: Abnormal   Collection Time: 11/26/17  2:48 PM  Result Value Ref Range   Sodium 142 135 - 145 mmol/L   Potassium 4.1 3.5 - 5.1 mmol/L   Chloride 107 101 - 111 mmol/L   CO2 24 22 - 32 mmol/L   Glucose, Bld 98 65 - 99 mg/dL   BUN 25 (H) 6 - 20 mg/dL   Creatinine, Ser 1.53 (H) 0.61 - 1.24 mg/dL   Calcium 9.9 8.9 - 10.3 mg/dL   Total Protein 8.1 6.5 - 8.1 g/dL   Albumin 4.7 3.5 - 5.0 g/dL   AST 24 15 - 41 U/L   ALT 22 17 - 63 U/L   Alkaline Phosphatase 111 38 - 126 U/L   Total Bilirubin 1.0 0.3 - 1.2 mg/dL   GFR calc non Af Amer 50 (L) >60 mL/min   GFR calc Af Amer 58 (L) >60 mL/min    Comment: (NOTE) The eGFR has been calculated using the CKD EPI equation. This calculation has not been validated in all clinical situations. eGFR's persistently <60 mL/min signify possible Chronic Kidney Disease.    Anion gap 11 5 - 15  Hemoglobin A1c     Status: Abnormal   Collection Time: 11/27/17  7:00 AM   Result Value Ref Range   Hgb A1c MFr Bld 5.7 (H) 4.8 - 5.6 %    Comment: (NOTE) Pre diabetes:          5.7%-6.4% Diabetes:              >6.4% Glycemic control for   <7.0% adults with diabetes    Mean Plasma Glucose 116.89 mg/dL    Comment: Performed at [facility] [address], [place], [place] [address]  Lipid panel     Status: Abnormal   Collection Time: 11/27/17  7:00 AM  Result Value Ref Range   Cholesterol 154 0 - 200 mg/dL   Triglycerides 206 (H) <150 mg/dL   HDL 27 (L) >40 mg/dL   Total CHOL/HDL Ratio 5.7 RATIO   VLDL 41 (H) 0 - 40 mg/dL   LDL Cholesterol 86 0 - 99 mg/dL    Comment:        Total Cholesterol/HDL:CHD Risk Coronary Heart Disease Risk Table                     Men   Women  1/2 Average Risk   3.4   3.3  Average Risk       5.0   4.4  2 X  Average Risk   9.6   7.1  3 X Average Risk  23.4   11.0        Use the calculated Patient Ratio above and the CHD Risk Table to determine the patient's CHD Risk.        ATP III CLASSIFICATION (LDL):  <100     mg/dL   Optimal  100-129  mg/dL   Near or Above                    Optimal  130-159  mg/dL   Borderline  160-189  mg/dL   High  >190     mg/dL   Very High Performed at [facility] [address] [place], [place] [address]     Blood Alcohol level:  Lab Results  Component Value Date   [facility] <10 11/26/2017   ETH <10 08/65/7846    Metabolic Disorder Labs: Lab Results  Component Value Date   HGBA1C 5.7 (H) 11/27/2017   MPG 116.89 11/27/2017   No results found for: PROLACTIN Lab Results  Component Value Date   CHOL 154 11/27/2017   TRIG 206 (H) 11/27/2017   HDL 27 (L) 11/27/2017   CHOLHDL 5.7 11/27/2017   VLDL 41 (H) 11/27/2017   LDLCALC 86 11/27/2017    Physical Findings: AIMS: Facial and Oral Movements Muscles of Facial Expression: None, normal Lips and Perioral Area: None, normal Jaw: None, normal Tongue: None, normal,Extremity Movements Upper (arms, wrists, hands,  fingers): None, normal Lower (legs, knees, ankles, toes): None, normal, Trunk Movements Neck, shoulders, hips: None, normal, Overall Severity Severity of abnormal movements (highest score from questions above): None, normal Incapacitation due to abnormal movements: None, normal Patient's awareness of abnormal movements (rate only patient's report): No Awareness, Dental Status Current problems with teeth and/or dentures?: No Does patient usually wear dentures?: No  CIWA:    COWS:     Musculoskeletal: Strength & Muscle Tone: within normal limits Gait & Station: normal Patient leans: N/A  Psychiatric Specialty Exam: Physical Exam  Nursing note and vitals reviewed. Constitutional: He is oriented to person, place, and time. He appears well-developed and well-nourished.  Cardiovascular: Normal rate.  Respiratory: Effort normal.  Musculoskeletal: Normal range of motion.  Neurological: He is alert and oriented to person, place, and time.  Skin: Skin is warm.    Review of Systems  Constitutional: Negative.   HENT: Negative.   Eyes: Negative.   Respiratory: Negative.   Cardiovascular: Negative.   Gastrointestinal: Negative.   Genitourinary: Negative.   Musculoskeletal: Negative.   Skin: Negative.   Neurological: Negative.   Endo/Heme/Allergies: Negative.   Psychiatric/Behavioral: Positive for depression and suicidal ideas (passive). Negative for hallucinations.  denies chest pain, denies dyspnea, no vomiting   Blood pressure 101/87, pulse 89, temperature 97.9 F (36.6 C), resp. rate 18, height _0  (1.626 m), weight 82.6 kg (182 lb), SpO2 96 %.Body mass index is 31.24 kg/m.  General Appearance: fairly groomed   Eye Contact:  Good  Speech:  Normal Rate  Volume:  Normal  Mood:  reports still feels depressed, but does acknowledge partial improvement   Affect:  blunted   Thought Process:  Goal Directed and Descriptions of Associations: Intact/ concrete  Orientation:  Other:   fully alert and attentive   Thought Content:  no hallucinations, no delusions, not internally preoccupied , focused on stressors as above   Suicidal Thoughts:  Yes.  without intent/plan denies active suicidal or self injurious ideations at  this time , contracts for safety, denies any homicidal or violent ideations, and specifically also denies any HI or violent ideations towards wife or mother   Homicidal Thoughts:  No  Memory:  recent and remote grossly intact   Judgement:  Fair  Insight:  Fair  Psychomotor Activity:  Normal  Concentration:  Concentration: Good and Attention Span: Good  Recall:  Good  Fund of Knowledge:  Good  Language:  Good  Akathisia:  No  Handed:  Right  AIMS (if indicated):     Assets:  Communication Skills Desire for Improvement Financial Resources/Insurance Housing Physical Health Social Support Transportation  ADL's:  Intact  Cognition:  WNL  Sleep:  Number of Hours: 6.75   Assessment - patient presents with partially improved mood . Reports he is still depressed and upset about being unable to get the video system he wants . Describes ongoing passive SI, but contracts for safety on unit . Denies medication side effects. Presents with tendency toward concrete thought process and as noted, his stressors do not appear age appropriate and have a childish quality.  Patient has not had further episodes of unresponsiveness . 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, psychiatric illness, but outpatient Neurology follow up and work up to rule out possible seizure disorder is  indicated   Treatment Plan Summary: Treatment Plan reviewed as below today 12/14  Daily contact with patient to assess and evaluate symptoms and progress in treatment, Medication management and Plan is to:  -Continue Celexa 20 mg PO Daily for depression -Increase  Remeron to 15  mg PO QHS for depression, anxiety, insomnia  -Continue Risperidone 1 mg PO QHS  for mood disorder -Encourage group therapy participation to work on Radiographer, therapeutic and symptom reduction -Have encouraged family meeting with patient, wife, mother, staff, in order to review / assess family stressors, relationship patterns .   Jenne Campus, MD 11/28/2017, 11:18 AM   Patient ID: Glena Norfolk, male   DOB: 1961/12/01, 55 y.o.   MRN: [id number]

## 2017-11-28 NOTE — Progress Notes (Addendum)
  [facility] :  Will you be returning to the same living situation after discharge:  Yes,  home At discharge, do you have transportation home?: Yes,  family member . Patient scheduled for discharge on Saturday, 11/29/17 Do you have the ability to pay for your medications: Yes,  [employer] Medicare  Release of information consent forms completed and submitted to medical records by CSW.  Patient to Follow up at: Follow-up Information    Schedule an appointment as soon as possible for a [facility].   Specialty:  Neurology Contact information: [address] [place] [id number] [phone]       [facility].   Why:  Please walk in within 3 days of hospital discharge to be assessed for outpatient mental health services including medication management and counseling. Walk in hours are Monday-Friday 8am-3pm. Thank you.  Contact information: [address] [place], [place] [address] Phone: [phone] Fax: [phone]          Next level of care provider has access to [facility] Link:no  Safety Planning and Suicide Prevention discussed: Yes,  SPe completed with pt; contact attempts made with pt's wife.   Have you used any form of tobacco in the last 30 days? (Cigarettes, Smokeless Tobacco, Cigars, and/or Pipes): No  Has patient been referred to the Quitline?: N/A patient is not a smoker  Patient has been referred for addiction treatment: N/A  Pulte HomesHeather N Smart, LCSW 11/28/2017, 10:56 AM

## 2017-11-28 NOTE — Progress Notes (Signed)
  Pt complained of chest pain with breathing, EKG done and normal, vistaril 25 mg PO given for anxiety, pt led to his room and is resting at this time, no further complains. Will continue to monitor.

## 2017-11-28 NOTE — [facility] Suicide Risk Assessment (Signed)
[facility] INPATIENT:  Family/Significant Other Suicide Prevention Education  Suicide Prevention Education:  Contact Attempts: Loura PardonDonna Zamudio, wife, [phone], has been identified by the patient as the family member/significant other with whom the patient will be residing, and identified as the person(s) who will aid the patient in the event of a mental health crisis.  With written consent from the patient, two attempts were made to provide suicide prevention education, prior to and/or following the patient's discharge.  We were unsuccessful in providing suicide prevention education.  A suicide education pamphlet was given to the patient to share with family/significant other.  Date and time of first attempt:11/28/17, [id number] Date and time of second attempt:11/28/17, 10:41  Lorri FrederickWierda, Laconya Clere Jon, LCSW 11/28/2017, 10:41 AM

## 2017-11-28 NOTE — Progress Notes (Signed)
Pt seems to be more quiet and subdued this evening.  He still endorses passive suicidal thoughts, but can contract for safety.  He denies HI/AVH.  He is not sure that he wants to return home.  He stays to himself with minimal interaction with the other patients.  He took his meds early tonight and went to bed.  Support and encouragement offered.  Discharge plans are in process.  Safety maintained with q15 minute checks.

## 2017-11-28 NOTE — Progress Notes (Signed)
Recreation Therapy Notes  Date: 11/28/17  Time: 0930 Location: 300 [address] Dayroom  Group Topic: Stress Management  Goal Area(s) Addresses:  Patient will verbalize importance of using healthy stress management.  Patient will identify positive emotions associated with healthy stress management.   Intervention: Stress Management  Activity :  Guided Imagery.  LRT introduced the stress management technique of guided imagery.  LRT read Joseph script that allowed patients to envision lying in the sun and watching the clouds go by.  Patients were to listen and follow along as script was read.  Education:  Stress Management, Discharge Planning.   Education Outcome: Acknowledges edcuation/In group clarification offered/Needs additional education  Clinical Observations/Feedback: Pt did not attend group.    Devin Joseph, LRT/CTRS         Devin Joseph 11/28/2017 11:46 AM 

## 2017-11-28 NOTE — Progress Notes (Signed)
Adult Psychoeducational Group Note  Date:  11/28/2017 Time:  10:46 PM  Group Topic/Focus:  Wrap-Up Group:   The focus of this group is to help patients review their daily goal of treatment and discuss progress on daily workbooks.  Participation Level:  Active  Participation Quality:  Appropriate  Affect:  Appropriate  Cognitive:  Appropriate  Insight: Good  Engagement in Group:  Engaged  Modes of Intervention:  Activity  Additional Comments:  Patient rated his day a 10. Goal is to work things out with family.  Natasha MeadKiara M Ife Vitelli 11/28/2017, 10:46 PM

## 2017-11-28 NOTE — Progress Notes (Signed)
Adult Psychoeducational Group Note  Date:  11/28/2017 Time:  2:01 PM  Group Topic/Focus:  Healthy Communication:   The focus of this group is to discuss communication, barriers to communication, as well as healthy ways to communicate with others.  Participation Level:  Active  Participation Quality:  Appropriate  Affect:  Appropriate  Cognitive:  Alert  Insight: Appropriate  Engagement in Group:  Engaged  Modes of Intervention:  Activity  Additional Comments:  Pt did participate in all activities and discussions.  Lakeria Starkman R Thersea Manfredonia 11/28/2017, 2:01 PM

## 2017-11-29 NOTE — [facility] Counselor (Signed)
Clinical Social Work Note  Returned call to wife Loura PardonDonna Eriksson [phone], who expressed concerns that there may be plans to discharge patient soon, especially since he is refusing to receive visits or phone calls from wife or mother because "you are stressing me out."  He refused to even see her when he demanded she bring his clothing.  She would very much welcome the family session being recommended by doctor.  She stated he told her last night on the phone (before insisting adamantly he did not want to talk or see her) that he was telling the hospital staff he is not suicidal because he is "ready to go" and his intention would be to "do the same thing."  He told her his medication is not working and that he is still upset.  She stated that he is providing misinformation when he says that this latest incident was caused by arguing with his mother, as it has really been his wife who does not want him to purchase things he already owns because they cannot afford "newer, bigger" things all the time.  When told about the suggestion that the family pursue a neurological consult, she stated that pt has had 3 EEGs while in catatonic episodes and more while not catatonic, with all having normal results.  He has had multiple CT scans, multiple MRIs, has been checked for strokes and heart attacks, and nothing has ever been determined to be wrong.  He did have 2 concussions as a young child but no other head injuries.  Ambrose MantleMareida Grossman-Orr, LCSW 11/29/2017, 5:08 PM

## 2017-11-29 NOTE — Progress Notes (Signed)
[facility] MD Progress Note  11/29/2017 12:30 PM Devin Joseph  MRN:  [id number]   Subjective:  Patient reports " I feel OK today" and is currently focused on being discharged soon. States " I want to be home". Less ruminative about stressors today. ( Had been ruminative about wife and mother not allowing him to have a certain video game system he wanted ) . Denies suicidal ideations. Currently denies medication side effects.  Objective:  I have reviewed chart notes  and have met with patient. Patient presents alert, attentive, minimizing depression today, denying any SI. Currently future oriented and focusing on being discharged soon. No further episodes of non-responsiveness. Denies medication side effects. Visible on unit, going to some groups  No agitated or disruptive behaviors .   Principal Problem: MDD (major depressive disorder), recurrent severe, without psychosis ([place]) Diagnosis:   Patient Active Problem List   Diagnosis Date Noted  . MDD (major depressive disorder), recurrent severe, without psychosis ([place]) [F33.2] 11/25/2017  . Severe recurrent major depression without psychotic features ([place]) [F33.2] 09/10/2017   Total Time spent with patient: 15 minutes  Past Psychiatric History: See H&P  Past Medical History:  Past Medical History:  Diagnosis Date  . Depression   . Suicidal behavior without attempted self-injury   . Thyroid disease     Past Surgical History:  Procedure Laterality Date  . CHOLECYSTECTOMY    . HERNIA REPAIR     Family History: History reviewed. No pertinent family history. Family Psychiatric  History: See H&P Social History:  Social History   Substance and Sexual Activity  Alcohol Use No     Social History   Substance and Sexual Activity  Drug Use No    Social History   Socioeconomic History  . Marital status: Single    Spouse name: None  . Number of children: None  . Years of education: None  . Highest education level: None   Social Needs  . Financial resource strain: None  . Food insecurity - worry: None  . Food insecurity - inability: None  . Transportation needs - medical: None  . Transportation needs - non-medical: None  Occupational History  . None  Tobacco Use  . Smoking status: Former Research scientist (life sciences)  . Smokeless tobacco: Never Used  Substance and Sexual Activity  . Alcohol use: No  . Drug use: No  . Sexual activity: None  Other Topics Concern  . None  Social History Narrative  . 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  Sleep: Good  Appetite:  Good  Current Medications: Current Facility-Administered Medications  Medication Dose Route Frequency Provider Last Rate Last Dose  . acetaminophen (TYLENOL) tablet 325 mg  325 mg Oral Q6H PRN Okonkwo, Justina A, NP      . alum & mag hydroxide-simeth (MAALOX/MYLANTA) 200-200-20 MG/5ML suspension 30 mL  30 mL Oral Q6H PRN Okonkwo, Justina A, NP      . aspirin EC tablet 81 mg  81 mg Oral QHS Okonkwo, Justina A, NP   81 mg at 11/28/17 2112  . atorvastatin (LIPITOR) tablet 40 mg  40 mg Oral QHS Okonkwo, Justina A, NP   40 mg at 11/28/17 2112  . citalopram (CELEXA) tablet 20 mg  20 mg Oral Daily Okonkwo, Justina A, NP   20 mg  at 11/29/17 0857  . docusate sodium (COLACE) capsule 100 mg  100 mg Oral Daily PRN Okonkwo, Justina A, NP      . hydrOXYzine (ATARAX/VISTARIL) tablet 25 mg  25 mg Oral Q6H PRN Okonkwo, Justina A, NP   25 mg at 11/28/17 0745  . levothyroxine (SYNTHROID, LEVOTHROID) tablet 112 mcg  112 mcg Oral QAC breakfast Okonkwo, Justina A, NP   112 mcg at 11/29/17 0643  . mirtazapine (REMERON) tablet 15 mg  15 mg Oral QHS Kymani Joseph, Devin Peer, MD   15 mg at 11/28/17 2112  . ondansetron (ZOFRAN) tablet 4 mg  4 mg Oral Q8H PRN Okonkwo, Justina A, NP       . risperiDONE (RISPERDAL) tablet 1 mg  1 mg Oral QHS Illianna Paschal, Devin Peer, MD   1 mg at 11/28/17 2112    Lab Results:  No results found for this or any previous visit (from the past 48 hour(s)).  Blood Alcohol level:  Lab Results  Component Value Date   ETH <10 11/26/2017   ETH <10 40/34/7425    Metabolic Disorder Labs: Lab Results  Component Value Date   HGBA1C 5.7 (H) 11/27/2017   MPG 116.89 11/27/2017   No results found for: PROLACTIN Lab Results  Component Value Date   CHOL 154 11/27/2017   TRIG 206 (H) 11/27/2017   HDL 27 (L) 11/27/2017   CHOLHDL 5.7 11/27/2017   VLDL 41 (H) 11/27/2017   LDLCALC 86 11/27/2017    Physical Findings: AIMS: Facial and Oral Movements Muscles of Facial Expression: None, normal Lips and Perioral Area: None, normal Jaw: None, normal Tongue: None, normal,Extremity Movements Upper (arms, wrists, hands, fingers): None, normal Lower (legs, knees, ankles, toes): None, normal, Trunk Movements Neck, shoulders, hips: None, normal, Overall Severity Severity of abnormal movements (highest score from questions above): None, normal Incapacitation due to abnormal movements: None, normal Patient's awareness of abnormal movements (rate only patient's report): No Awareness, Dental Status Current problems with teeth and/or dentures?: No Does patient usually wear dentures?: No  CIWA:    COWS:     Musculoskeletal: Strength & Muscle Tone: within normal limits Gait & Station: normal Patient leans: N/A  Psychiatric Specialty Exam: Physical Exam  Nursing note and vitals reviewed. Constitutional: He is oriented to person, place, and time. He appears well-developed and well-nourished.  Cardiovascular: Normal rate.  Respiratory: Effort normal.  Musculoskeletal: Normal range of motion.  Neurological: He is alert and oriented to person, place, and time.  Skin: Skin is warm.    Review of Systems  Constitutional: Negative.   HENT: Negative.   Eyes:  Negative.   Respiratory: Negative.   Cardiovascular: Negative.   Gastrointestinal: Negative.   Genitourinary: Negative.   Musculoskeletal: Negative.   Skin: Negative.   Neurological: Negative.   Endo/Heme/Allergies: Negative.   Psychiatric/Behavioral: Positive for depression and suicidal ideas (passive). Negative for hallucinations.  denies headache, denies chest pain, denies dyspnea, no vomiting   Blood pressure 112/87, pulse 98, temperature (!) 97.4 F (36.3 C), temperature source Oral, resp. rate 20, height _0  (1.626 m), weight 82.6 kg (182 lb), SpO2 96 %.Body mass index is 31.24 kg/m.  General Appearance: fairly groomed   Eye Contact:  Good  Speech:  Normal Rate  Volume:  Normal  Mood:  today minimizes depression, states mood is "OK"  Affect:  remains blunted , subtly irritable  Thought Process:  Goal Directed and Descriptions of Associations: Intact/ concrete  Orientation:  Other:  fully alert and attentive  Thought Content:  no hallucinations, no delusions, not internally preoccupied, less ruminative about home stressors  Suicidal Thoughts:  No today denies suicidal or self injurious ideations, denies homicidal or violent ideations, specifically denies any violent or homicidal ideations towards mother or wife   Homicidal Thoughts:  No  Memory:  recent and remote grossly intact   Judgement:  Fair  Insight:  Fair  Psychomotor Activity:  Normal  Concentration:  Concentration: Good and Attention Span: Good  Recall:  Good  Fund of Knowledge:  Good  Language:  Good  Akathisia:  No  Handed:  Right  AIMS (if indicated):     Assets:  Communication Skills Desire for Improvement Financial Resources/Insurance [place] Support Transportation  ADL's:  Intact  Cognition:  WNL  Sleep:  Number of Hours: 6.75   Assessment - today patient denies feeling depressed, and states he feels he is ready to discharge soon. He presents calm, cooperative, vaguely  blunted and irritable . Today not focused on issues regarding not being allowed to have a video gaming system he wanted . Denies medication side effects.  As per prior notes, patient has presented with age inappropriate, regressed conflict with mother and wife, essentially expressing anger and depression because they do not approve of a video game console system he wants . I have encouraged a family meeting with family prior to discharge to assess family dynamics and provide support . Patient ambivalent about this at present.   Treatment Plan Summary: Treatment Plan reviewed as below today 12/15 Daily contact with patient to assess and evaluate symptoms and progress in treatment, Medication management and Plan is to:  -Continue Celexa 20 mg PO Daily for depression, anxiety -Continue   Remeron  15  mg PO QHS for depression, anxiety, insomnia  -Continue Risperidone 1 mg PO QHS for mood disorder -Encourage group therapy participation to work on coping skills and symptom reduction -Have encouraged family meeting with patient, wife, mother, staff, in order to review / assess family stressors, relationship patterns .   Jenne Campus, MD 11/29/2017, 12:30 PM   Patient ID: Devin Joseph, male   DOB: [birth year], 56 y.o.   MRN: [id number]

## 2017-11-29 NOTE — [facility] Suicide Risk Assessment (Signed)
[facility] INPATIENT:  Family/Significant Other Suicide Prevention Education  Suicide Prevention Education:  Education Completed; wife Devin Joseph [phone] has been identified by the patient as the family member/significant other with whom the patient will be residing, and identified as the person(s) who will aid the patient in the event of a mental health crisis (suicidal ideations/suicide attempt).  With written consent from the patient, the family member/significant other has been provided the following suicide prevention education, prior to the and/or following the discharge of the patient.  The suicide prevention education provided includes the following:  Suicide risk factors  Suicide prevention and interventions  National Suicide Hotline telephone number  [facility] assessment telephone number  [facility] Emergency Assistance 911  [facility] and/or Residential Mobile Crisis Unit telephone number  Request made of family/significant other to:  Remove weapons (e.g., guns, rifles, knives), all items previously/currently identified as safety concern.    Remove drugs/medications (over-the-counter, prescriptions, illicit drugs), all items previously/currently identified as a safety concern.  The family member/significant other verbalizes understanding of the suicide prevention education information provided.  The family member/significant other agrees to remove the items of safety concern listed above.  There are no guns in the home.  Devin Joseph 11/29/2017, 5:05 PM

## 2017-11-29 NOTE — [facility] Group Notes (Signed)
[facility] Group Notes: (Clinical Social Work)   11/29/2017      Type of Therapy:  Group Therapy   Participation Level:  Did Not Attend despite MHT prompting   Ambrose MantleMareida Grossman-Orr, LCSW 11/29/2017, 12:56 PM

## 2017-11-29 NOTE — Progress Notes (Signed)
Patient isolative and sleeping in room most of shift. Did not attend groups, attended meals in dining room. Little interaction with peers or staff.

## 2017-11-29 NOTE — Progress Notes (Signed)
Patient ID: Devin Joseph, male   DOB: 1960/12/17, 56 y.o.   MRN: [id number]   D: Patient lying in bed with eyes closed A: Staff will monitor on q 15 minute checks, follow treatment plan, and give meds as ordered. R: Appears to be sleeping.

## 2017-11-29 NOTE — Progress Notes (Signed)
Nursing Progress Note 1900-2200  D) Patient presents with flat affect and irritable mood. Patient has impulsive behavior and at times appears demanding but is cooperative with staff. Patient requests meal tray this evening reporting, "I didn't go to dinner". Patient did not attend group and stated, "that does nothing for me". Patient denies SI/HI/AVH or pain. Patient contracts for safety on the unit. Patient compliant with medications and was provided scheduled medications without incident.  A) Emotional support given. 1:1 interaction and active listening provided. Patient medicated as prescribed. Medications and plan of care reviewed with patient. Patient verbalized understanding without further questions. Snacks and fluids provided. Opportunities for questions or concerns presented to patient. Patient encouraged to continue to work on treatment goals. Labs, vital signs and patient behavior monitored throughout shift. Patient safety maintained with q15 min safety checks. High/Low fall risk precautions in place and reviewed with patient; patient verbalized understanding.  R) Patient receptive to interaction with nurse. Patient remains safe on the unit at this time. Report given to receiving RN.

## 2017-11-29 NOTE — Progress Notes (Signed)
Adult Psychoeducational Group Note  Date:  11/29/2017 Time:  10:33 PM  Group Topic/Focus:  Wrap-Up Group:   The focus of this group is to help patients review their daily goal of treatment and discuss progress on daily workbooks.  Participation Level:  Active  Participation Quality:  Appropriate  Affect:  Appropriate  Cognitive:  Appropriate  Insight: Appropriate  Engagement in Group:  Engaged  Modes of Intervention:  Discussion  Additional Comments:  Patient attended group and said that his day was a 10.  He said he felt well rested because he slept all day.  Shiza Thelen W Mccoy Testa 11/29/2017, 10:33 PM

## 2017-11-29 NOTE — [facility] Group Notes (Signed)
Life SKills   Date:  11/29/2017  Time:  1300  Type of Therapy:  Nurse Education  /  The group focuses on teaching patients how to identify their needs  And ho wot develop skills needed to get needs met.  Participation Level:  Did Not Attend  Participation Quality:    Affect:    Cognitive:    Insight:    Engagement in Group:    Modes of Intervention:    Summary of Progress/Problems:  Lauralyn Primes 11/29/2017, 2:21 PM

## 2017-11-30 MED ORDER — HYDROXYZINE HCL 25 MG PO TABS: 25.0000 mg | ORAL_TABLET | Freq: Four times a day (QID) | ORAL | 0 refills | Status: DC | PRN

## 2017-11-30 MED ORDER — CITALOPRAM HYDROBROMIDE 20 MG PO TABS: 20.0000 mg | ORAL_TABLET | Freq: Every day | ORAL | 0 refills | Status: DC

## 2017-11-30 MED ORDER — RISPERIDONE 1 MG PO TABS: 1.0000 mg | ORAL_TABLET | Freq: Every day | ORAL | 0 refills | Status: DC

## 2017-11-30 MED ORDER — ATORVASTATIN CALCIUM 40 MG PO TABS: 40.0000 mg | ORAL_TABLET | Freq: Every day | ORAL | 0 refills | Status: AC

## 2017-11-30 MED ORDER — MIRTAZAPINE 15 MG PO TABS: 15.0000 mg | ORAL_TABLET | Freq: Every day | ORAL | 0 refills | Status: DC

## 2017-11-30 NOTE — [facility] Group Notes (Signed)
[facility]  Date/Time:  11/30/2017  10:00-11:00AM  Type of Therapy and Topic:  Group Therapy:  Music and Mood  Participation Level:  Active   Description of Group: In this process group, members listened to a variety of genres of music and identified that different types of music evoke different responses.  Patients were encouraged to identify music that was soothing for them and music that was energizing for them.  Patients discussed how this knowledge can help with wellness and recovery in various ways including managing depression and anxiety as well as encouraging healthy sleep habits.    Therapeutic Goals: 1. Patients will explore the impact of different varieties of music on mood 2. Patients will verbalize the thoughts they have when listening to different types of music 3. Patients will identify music that is soothing to them as well as music that is energizing to them 4. Patients will discuss how to use this knowledge to assist in maintaining wellness and recovery 5. Patients will explore the use of music as a coping skill  Summary of Patient Progress:  At the beginning of group, patient expressed that he felt wonderful, and he smiled, participated heavily, then at the end of group said he felt good.  Therapeutic Modalities: Solution Focused Brief Therapy Motivational Interviewing Activity   Ambrose MantleMareida Grossman-Orr, LCSW 11/30/2017 11:36 AM

## 2017-11-30 NOTE — [facility] Group Notes (Signed)
[facility] Group Notes:  (Nursing/MHT/Case Management/Adjunct)  Date:  11/30/2017  Time:  3:15 PM  Type of Therapy:  Nurse Education  /  The group is focused on teaching patients how to identify their primary emotion related to their anger and then how to develop the skills needed to manage their anger effectively.  Participation Level:  Active  Participation Quality:  Attentive  Affect:  Defensive  Cognitive:  Alert  Insight:  Limited  Engagement in Group:  Engaged  Modes of Intervention:  Education  Summary of Progress/Problems:  Devin Joseph, Devin Joseph 11/30/2017, 3:15 PM

## 2017-11-30 NOTE — Progress Notes (Signed)
Discharge Note :  Discharge instructions/medications/follow up appointments discussed with pt and his wife. Prescriptions given,and patients belongings returned to pt. Pt will follow up with Dr Lilian KapurMcDonald   Pt verbalizes understanding.  Pt denies SI/HI/AVH.

## 2017-11-30 NOTE — [facility] Suicide Risk Assessment (Signed)
[facility]   Principal Problem: MDD (major depressive disorder), recurrent severe, without psychosis ([facility]) Discharge Diagnoses:  Patient Active Problem List   Diagnosis Date Noted  . MDD (major depressive disorder), recurrent severe, without psychosis ([facility]) [F33.2] 11/25/2017  . Severe recurrent major depression without psychotic features ([facility]) [F33.2] 09/10/2017    Total Time spent with patient: 30 minutes  Musculoskeletal: Strength & Muscle Tone: within normal limits Gait & Station: normal Patient leans: N/A  Psychiatric Specialty Exam: ROS denies headache, no chest pain , no shortness of breath, no vomiting   Blood pressure 107/80, pulse 94, temperature 98.2 F (36.8 C), temperature source Oral, resp. rate 18, height 5\' 4"  (1.626 m), weight 82.6 kg (182 lb), SpO2 96 %.Body mass index is 31.24 kg/m.  General Appearance: Fairly Groomed  Patent attorneyye Contact::  Good  Speech:  Normal Rate409  Volume:  Normal  Mood:  states he feels better, denies feeling depressed today  Affect:  more reactive , slightly irritable at times  Thought Process:  Linear and Descriptions of Associations: Intact  Orientation:  Other:  fully alert and attentive   Thought Content:  denies halluciantions, no delusions expressed, not internally preoccupied   Suicidal Thoughts:  No denies any suicidal or self injurious ideations, denies homicidal ideations, specifically also denies any violent or homicidal ideations towards wife,mother or any family   Homicidal Thoughts:  No  Memory:  recent and remote grossly intact  Judgement:  Fair- improving   Insight:  Fair- improving   Psychomotor Activity:  Normal  Concentration:  Good  Recall:  Good  Fund of Knowledge:Good  Language: Good  Akathisia:  Negative  Handed:  Right  AIMS (if indicated):     Assets:  Housing Social Support  Sleep:  Number of Hours: 6.25  Cognition: WNL  ADL's:  Intact   Mental Status Per Nursing Assessment::    On Admission:  Suicidal ideation indicated by patient  Demographic Factors:  56 year old married male, lives with wife and mother, on disability  Loss Factors: Disability,family stressors- reports not being supported by wife/mother in getting a particular video game system he has wanted was a significant stressor  Historical Factors: History of depression, history of prior admissions , history of suicidal ideations, history of learning disability  Risk Reduction Factors:   Living with another person, especially a relative and Positive social support  Continued Clinical Symptoms:  At this time patient is alert, attentive, reports improved mood and denies feeling depressed, affect is appropriate, no thought disorder, no suicidal ideations, no self injurious ideations, no homicidal or violent ideations, specifically also denies any violent or homicidal ideations towards wife or mother or any family member , denies hallucinations and does not present internally preoccupied , no delusions expressed, future oriented . Presents less ruminative about family stressors, video game issue. States that he is working on Print production plannerimproving coping skills and plans to " spend time with my dog " if he feels upset of frustrated. States that spending time with pets is helpful for him as a way of managing anxiety. Patient had been encouraged to consider family meeting with wife prior to discharge but declined. He did provide express consent for me to contact wife and I spoke with her on phone. She states he does seem to be improved compared to admission status and is agreeing with discharge today. She will pick him up later today. She expressed concern about medication compliance , as non compliance has been an  issue in the past. Patient states he is planning to take medication regularly .  Of note, wife corroborated history of brief episodes of non responsiveness that have been occurring for more than a year. She states he  has been worked up by Insurance account managereurologist to include having EEG during episode , and that he has been told it is psychiatric, not seizure disorder .   Cognitive Features That Contribute To Risk:  No gross cognitive deficits noted upon discharge. Is alert , attentive.   Suicide Risk:  Mild:  Suicidal ideation of limited frequency, intensity, duration, and specificity.  There are no identifiable plans, no associated intent, mild dysphoria and related symptoms, good self-control (both objective and subjective assessment), few other risk factors, and identifiable protective factors, including available and accessible social support.  Follow-up Information    Schedule an [facility] as [facility] as possible [facility]   Specialty:  Neurology Contact information: [address] [place] [id number] [phone]       [facility] Follow up.   Why:  Please walk in within 3 days of hospital discharge to be assessed for outpatient mental health services including medication management and counseling. Walk in hours are Monday-Friday 8am-3pm. Thank you.  Contact information: [address] [place], [place] [address] Phone: [phone] Fax: [phone]          Plan Of Care/Follow-up recommendations:  Activity:  as tolerated  Diet:  regular Tests:  NA Other:  see below  Patient is requesting discharge and there are no current grounds for involuntary commitment . Plans to return home, and wife will come pick him up later today We reviewed precaution of not driving or operating machinery based on history of episodes of unresponsiveness- patient reports he does not drive  Plans to follow up as above, also has an established PCP , [facility] for medical management as needed .   Craige CottaFernando A Zakiyah Diop, MD 11/30/2017, 1:19 PM

## 2017-11-30 NOTE — [facility] Group Notes (Signed)
Life SKills   Date:  11/30/2017  Time:  3:12 PM  Type of Therapy:  Nurse Education  /  The group focuses on teaching patients how to identify their primary emotion related to their anger and then how to develop the healthy skills needed to manage their anger.  Participation Level:  Active  Participation Quality:  Attentive  Affect:  Depressed  Cognitive:  Alert  Insight:  Limited  Engagement in Group:  Engaged  Modes of Intervention:  Education  Summary of Progress/Problems:  Devin Joseph, Devin Joseph 11/30/2017, 3:12 PM
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
## 2018-06-25 IMAGING — CT CT CERVICAL SPINE W/O CM
3 of 5 series · 15 of 33 positions shown, 17 images · non-contrast
Comparison: Brain MRI September 01, 2016

CLINICAL DATA: Unresponsive with nonreactive pupils. Questionable
fall

EXAM:
CT HEAD WITHOUT CONTRAST
CT CERVICAL SPINE WITHOUT CONTRAST
TECHNIQUE: Multidetector CT imaging of the head and cervical spine was
performed following the standard protocol without intravenous
contrast. Multiplanar CT image reconstructions of the cervical spine
were also generated.

[Series 4: coronal soft tissue · coronal · 0.38mm/px · 3 of 67 slices shown]
[im 14/67  bone]
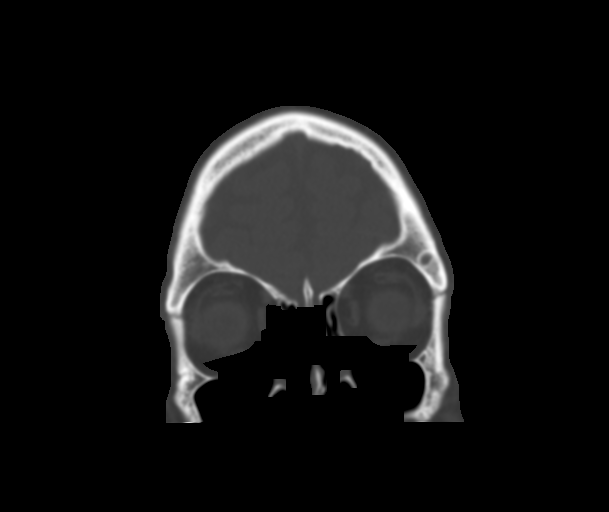
[im 27/67  bone]
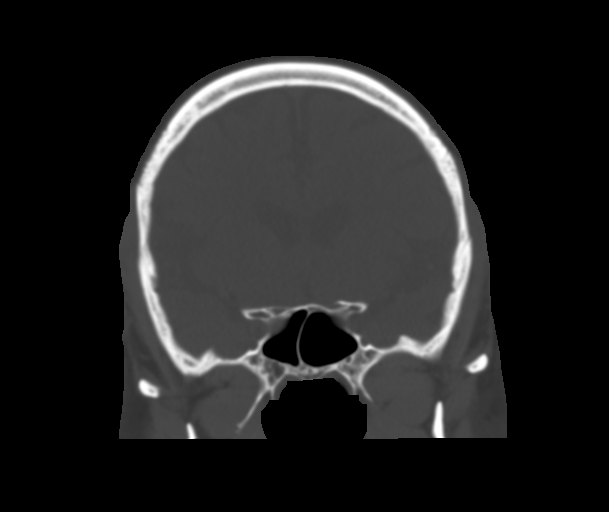
[im 40/67  bone]
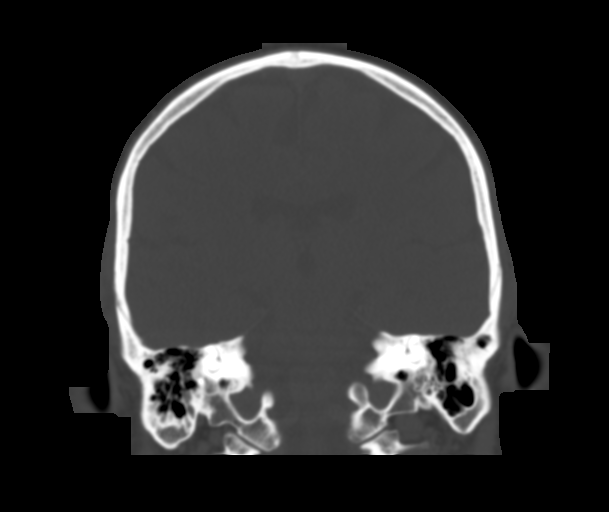

[Series 8: c spine soft · axial · 0.32mm/px · z∈[-334,-200]mm · 7 of 87 slices shown, 9 images]
[im 10/87  soft-tissue]
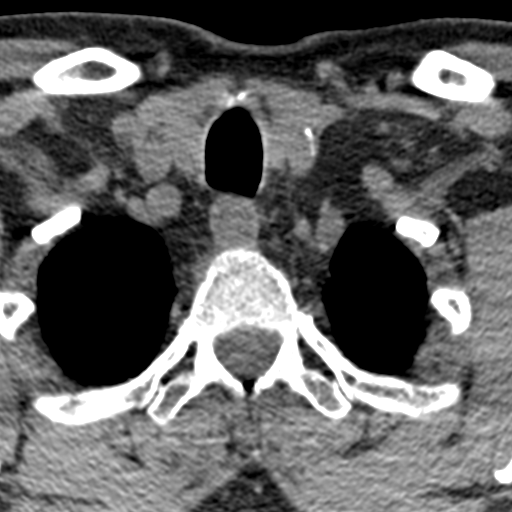
[im 10/87  bone]
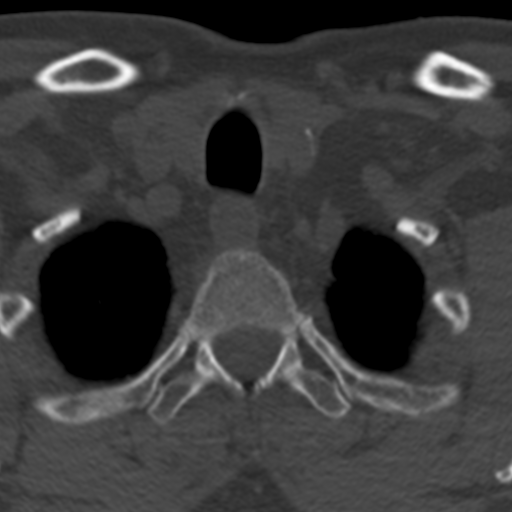
[im 20/87  bone]
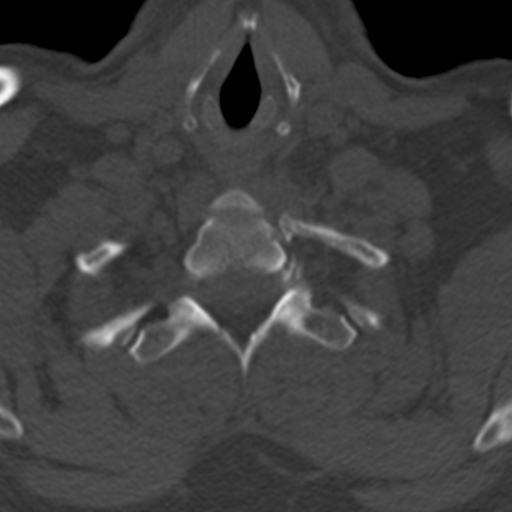
[im 29/87  bone]
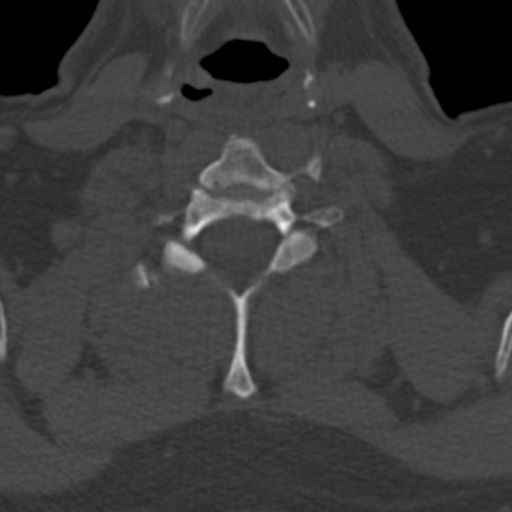
[im 48/87  bone]
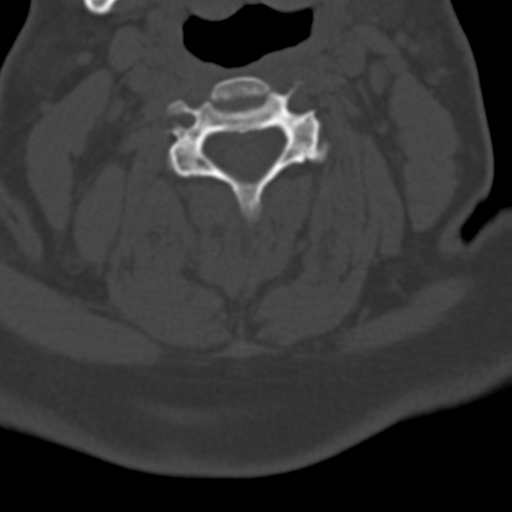
[im 58/87  soft-tissue]
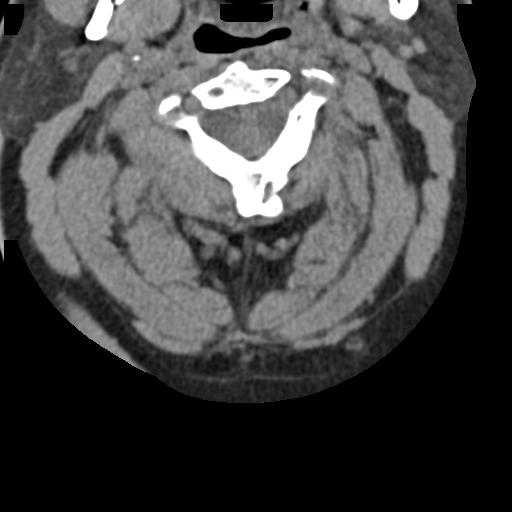
[im 58/87  bone]
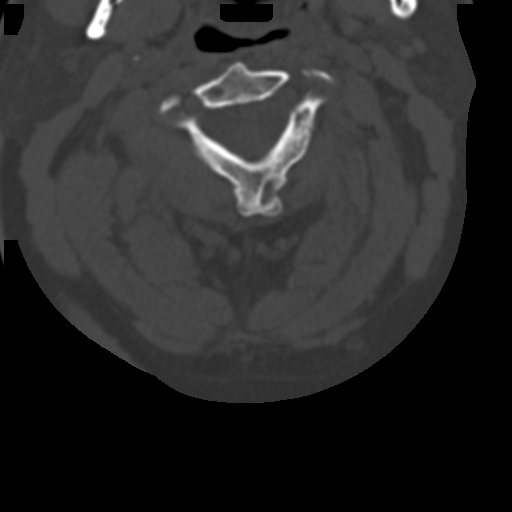
[im 67/87  bone]
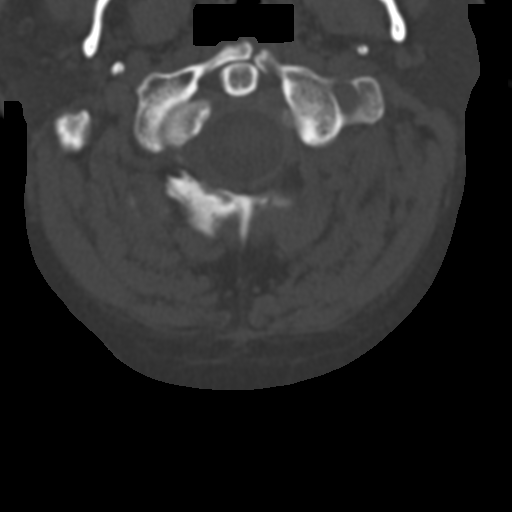
[im 77/87  bone]
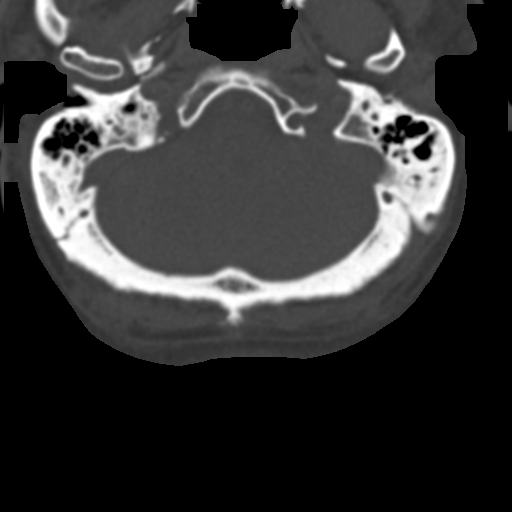

[Series 11: sagittal bone · sagittal · 0.38mm/px · 5 of 61 slices shown]
[im 11/61  bone]
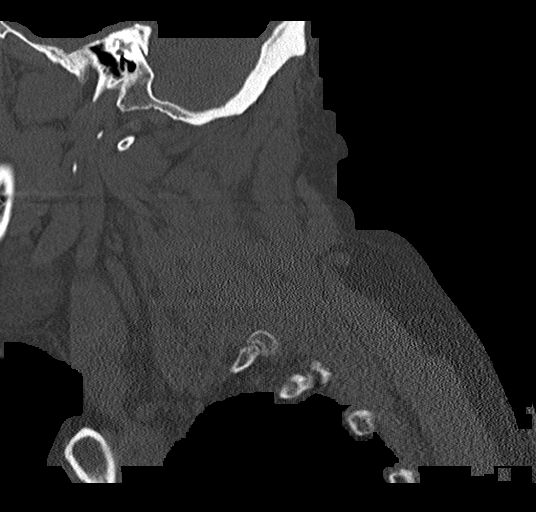
[im 21/61  bone]
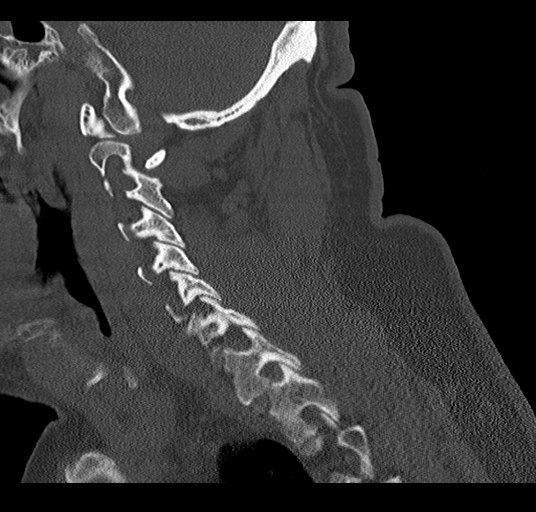
[im 31/61  bone]
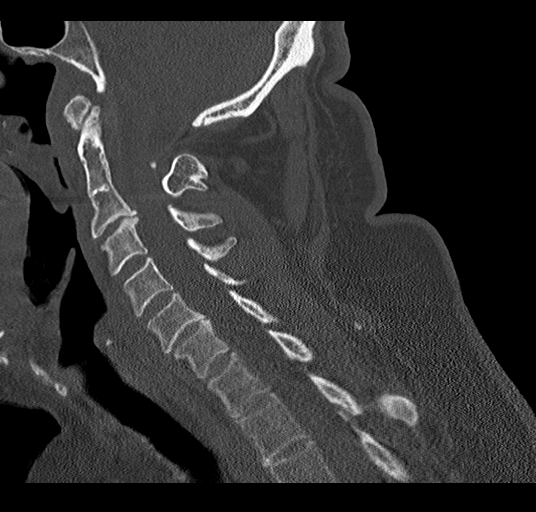
[im 41/61  bone]
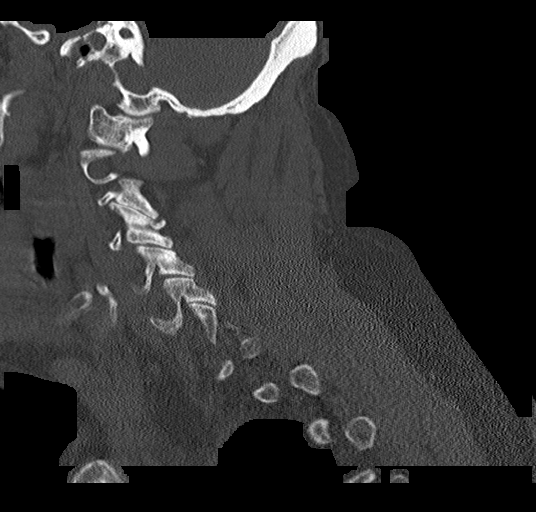
[im 51/61  bone]
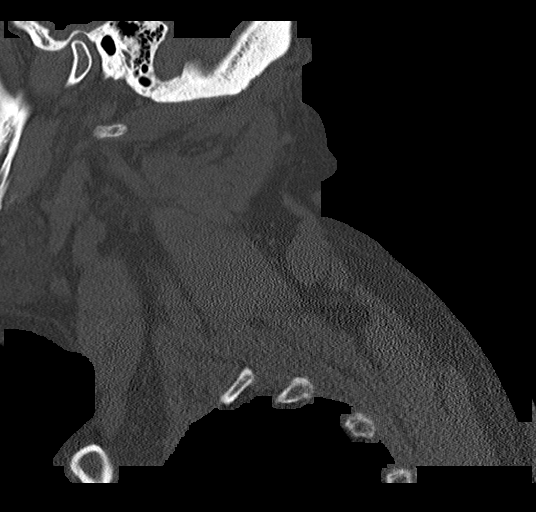

[15 of 33 positions shown; findings below may reference images not displayed]

FINDINGS: CT HEAD FINDINGS

Brain: Ventricles and sulci are upper normal for age with
ventricular configuration normal. There is no intracranial mass,
hemorrhage, extra-axial fluid collection, or midline shift. There is
minimal small vessel disease in the centra semiovale bilaterally.
There are symmetric calcifications in each basal ganglia region.
Elsewhere gray-white compartments appear normal. No evident acute
infarct.

Vascular: No hyperdense vessel.  No vascular calcification evident.

Skull: Bony calvarium appears intact.

Sinuses/Orbits: Visualized paranasal sinuses are clear. Orbits
appear symmetric bilaterally.

Other: Mild opacification of several mastoid air cells noted. No
air-fluid levels.

CT CERVICAL SPINE FINDINGS

Alignment: There is no appreciable spondylolisthesis.

Skull base and vertebrae: There is nonfusion of the anterior arch of
C1, a likely congenital variant. This area does not represent an
acute injury. The borders of this nonunion or well corticated.

Craniocervical junction and skull base regions otherwise appear
unremarkable.

No acute fracture is evident. There are no blastic or lytic bone
lesions.

Soft tissues and spinal canal: Prevertebral soft tissues and
predental space regions are normal. There are no paraspinous
lesions. There is no appreciable cord or canal hematoma.

Disc levels: There is ankylosis at C2-3, presumed congenital. There
is moderate disc space narrowing at C3-4, C6-7, and C7-T1. There is
facet hypertrophy at multiple levels. There is exit foraminal
narrowing due to bony hypertrophy on the left at C3-4 and at C6-7
bilaterally. No disc extrusion or stenosis evident.

Upper chest: Visualized upper lung zones are clear.

Other: There is calcification in the left carotid artery.
IMPRESSION: CT head: Minimal periventricular small vessel disease. Mild basal
ganglia calcification is likely physiologic in this age group. No
acute infarct evident. No mass or hemorrhage. There is mild mastoid
disease bilaterally.

CT cervical spine: No fracture or spondylolisthesis. Foci of
osteoarthritic change at several levels. Presumed congenital
nonfusion of the anterior arch of C1. Probable congenital ankylosis
at C2-3. Calcification noted in the left carotid artery.

## 2018-08-01 ENCOUNTER — Other Ambulatory Visit: Payer: Self-pay

## 2018-08-01 ENCOUNTER — Encounter (HOSPITAL_COMMUNITY): Payer: Self-pay

## 2018-08-01 ENCOUNTER — Inpatient Hospital Stay (HOSPITAL_COMMUNITY)
Admission: AD | Admit: 2018-08-01 | Discharge: 2018-08-05 | DRG: 885 | Disposition: A | Discharge status: Home / Self Care | Payer: PRIVATE HEALTH INSURANCE | Source: Intra-hospital | Attending: Psychiatry | Admitting: Psychiatry

## 2018-08-01 DIAGNOSIS — Z87891 Personal history of nicotine dependence: Secondary | ICD-10-CM

## 2018-08-01 DIAGNOSIS — F603 Borderline personality disorder: Secondary | ICD-10-CM | POA: Diagnosis not present

## 2018-08-01 DIAGNOSIS — E7800 Pure hypercholesterolemia, unspecified: Secondary | ICD-10-CM | POA: Diagnosis present

## 2018-08-01 DIAGNOSIS — Z79899 Other long term (current) drug therapy: Secondary | ICD-10-CM

## 2018-08-01 DIAGNOSIS — F322 Major depressive disorder, single episode, severe without psychotic features: Secondary | ICD-10-CM

## 2018-08-01 DIAGNOSIS — E039 Hypothyroidism, unspecified: Secondary | ICD-10-CM | POA: Diagnosis present

## 2018-08-01 DIAGNOSIS — R45851 Suicidal ideations: Secondary | ICD-10-CM | POA: Diagnosis present

## 2018-08-01 DIAGNOSIS — F445 Conversion disorder with seizures or convulsions: Secondary | ICD-10-CM | POA: Diagnosis present

## 2018-08-01 DIAGNOSIS — F410 Panic disorder [episodic paroxysmal anxiety] without agoraphobia: Secondary | ICD-10-CM | POA: Diagnosis present

## 2018-08-01 DIAGNOSIS — F332 Major depressive disorder, recurrent severe without psychotic features: Principal | ICD-10-CM | POA: Diagnosis present

## 2018-08-01 DIAGNOSIS — G4700 Insomnia, unspecified: Secondary | ICD-10-CM | POA: Diagnosis present

## 2018-08-01 DIAGNOSIS — K219 Gastro-esophageal reflux disease without esophagitis: Secondary | ICD-10-CM | POA: Diagnosis present

## 2018-08-01 MED ORDER — HALOPERIDOL LACTATE 5 MG/ML IJ SOLN
INTRAMUSCULAR | Status: AC
  Filled 2018-08-01: qty 1

## 2018-08-01 MED ORDER — HYDROXYZINE HCL 25 MG PO TABS
25.0000 mg | ORAL_TABLET | Freq: Three times a day (TID) | ORAL | Status: DC | PRN
Start: 2018-08-01 — End: 2018-08-05
  Administered 2018-08-04: 25 mg via ORAL

## 2018-08-01 MED ORDER — LORAZEPAM 2 MG/ML IJ SOLN
INTRAMUSCULAR | Status: AC
  Filled 2018-08-01: qty 1

## 2018-08-01 MED ORDER — ASENAPINE MALEATE 5 MG SL SUBL
5.0000 mg | SUBLINGUAL_TABLET | Freq: Two times a day (BID) | SUBLINGUAL | Status: DC
  Administered 2018-08-02 – 2018-08-05 (×8): 5 mg via SUBLINGUAL
  Filled 2018-08-01 (×14): qty 1

## 2018-08-01 MED ORDER — ALUM & MAG HYDROXIDE-SIMETH 200-200-20 MG/5ML PO SUSP: 30.0000 mL | ORAL | Status: DC | PRN

## 2018-08-01 MED ORDER — PANTOPRAZOLE SODIUM 40 MG PO TBEC
40.0000 mg | DELAYED_RELEASE_TABLET | Freq: Every day | ORAL | Status: DC
  Administered 2018-08-02 – 2018-08-05 (×4): 40 mg via ORAL
  Filled 2018-08-01 (×9): qty 1

## 2018-08-01 MED ORDER — LORAZEPAM 2 MG/ML IJ SOLN
2.0000 mg | Freq: Once | INTRAMUSCULAR | Status: AC
  Administered 2018-08-01: 2 mg via INTRAMUSCULAR

## 2018-08-01 MED ORDER — MAGNESIUM HYDROXIDE 400 MG/5ML PO SUSP: 30.0000 mL | Freq: Every day | ORAL | Status: DC | PRN

## 2018-08-01 MED ORDER — ACETAMINOPHEN 325 MG PO TABS: 650.0000 mg | ORAL_TABLET | Freq: Four times a day (QID) | ORAL | Status: DC | PRN

## 2018-08-01 MED ORDER — HALOPERIDOL LACTATE 5 MG/ML IJ SOLN
5.0000 mg | Freq: Once | INTRAMUSCULAR | Status: AC
  Administered 2018-08-01: 5 mg via INTRAMUSCULAR
  Filled 2018-08-01: qty 1

## 2018-08-01 MED ORDER — DIPHENHYDRAMINE HCL 50 MG/ML IJ SOLN
50.0000 mg | Freq: Once | INTRAMUSCULAR | Status: AC
  Administered 2018-08-01: 50 mg via INTRAMUSCULAR
  Filled 2018-08-01: qty 1

## 2018-08-01 MED ORDER — DIPHENHYDRAMINE HCL 50 MG/ML IJ SOLN
INTRAMUSCULAR | Status: AC
  Filled 2018-08-01: qty 1

## 2018-08-01 NOTE — [facility] Suicide Risk Assessment (Signed)
[facility]   Nursing information obtained from:  Patient Demographic factors:  Male, Low socioeconomic status, Unemployed, Caucasian Current Mental Status:  Suicidal ideation indicated by patient, Self-harm thoughts, Suicide plan, Intention to act on suicide plan, Belief that plan would result in death Loss Factors:  Decrease in vocational status Historical Factors:  Prior suicide attempts, Impulsivity Risk Reduction Factors:  Positive coping skills or problem solving skills, Positive social support, Living with another person, especially a relative  Total Time spent with patient: 20 minutes Principal Problem: <principal problem not specified> Diagnosis:   Patient Active Problem List   Diagnosis Date Noted  . MDD (major depressive disorder), recurrent severe, without psychosis (HCC) [F33.2] 11/25/2017  . Severe recurrent major depression without psychotic features (HCC) [F33.2] 09/10/2017   Subjective Data: Patient is seen and examined.  Patient is a 57 year old male with a reported past psychiatric history significant for borderline personality disorder and major depression.  The patient [facility] 07/31/2018 with suicidal ideation.  Patient stated that he became acutely suicidal because he got into an argument with his mother.  His mother lives with the patient and his wife.  She has been living with him for the last 3 years.  He stated that she "got on him" to help someone with some kind of cross beam.  She wanted him to go do that then.  He stated that that irritated him, and that he decided that he was going to kill himself.  He stated he is been suicidal for years.  He stated that little things make him become more suicidal.  [facility]  He has been previously on Remeron, Risperdal and Celexa.  He stated he is unable to remember all the different medicines he is been on.  He stated  that none of them have helped him.  When asked about his most recent medications he stated that his medicines "were not working and I have taken him in 3 days".  "If I have to take anymore pills I am just going to die anyway".  He stated [facility].  He stated he is already signed the paper so he can refuse to see his family members.  He was admitted to the hospital for evaluation and stabilization.  Continued Clinical Symptoms:    The "Alcohol Use Disorders Identification Test", Guidelines for Use in Primary Care, Second Edition.  World Science writerHealth Organization [facility](WHO). Score between 0-7:  no or low risk or alcohol related problems. Score between 8-15:  moderate risk of alcohol related problems. Score between 16-19:  high risk of alcohol related problems. Score 20 or above:  warrants further diagnostic evaluation for alcohol dependence and treatment.   CLINICAL FACTORS:   Depression:   Anhedonia Hopelessness Impulsivity Personality Disorders:   Cluster B More than one psychiatric diagnosis   Musculoskeletal: Strength & Muscle Tone: within normal limits Gait & Station: normal Patient leans: N/A  Psychiatric Specialty Exam: Physical Exam  Nursing note and vitals reviewed. Constitutional: He is oriented to person, place, and time. He appears well-developed and well-nourished.  HENT:  Head: Normocephalic and atraumatic.  Respiratory: Effort normal.  Neurological: He is alert and oriented to person, place, and time.    ROS  Blood pressure (P) 107/70, pulse (P) 81, temperature (P) 98 F (36.7 C), temperature source (P) Oral, resp. rate (P) 12, height (P)  5\' 4"  (1.626 m), weight (P) 82.6 kg, SpO2 (P) 98 %.Body mass index is 31.24 kg/m (pended).  General Appearance: Casual  Eye Contact:  Poor  Speech:  Normal Rate  Volume:  Increased  Mood:  Irritable  Affect:  Congruent  Thought Process:  Coherent  Orientation:   Full (Time, Place, and Person)  Thought Content:  Logical  Suicidal Thoughts:  Yes.  without intent/plan  Homicidal Thoughts:  No  Memory:  Immediate;   Poor Recent;   Poor Remote;   Poor  Judgement:  Impaired  Insight:  Lacking  Psychomotor Activity:  Increased  Concentration:  Concentration: Fair and Attention Span: Fair  Recall:  [employer]Fair  Fund of Knowledge:  Fair  Language:  Fair  Akathisia:  Negative  Handed:  Right  AIMS (if indicated):     Assets:  Desire for Improvement Housing Physical Health Resilience  ADL's:  Intact  Cognition:  WNL  Sleep:         COGNITIVE FEATURES THAT CONTRIBUTE TO RISK:  None    SUICIDE RISK:   Moderate:  Frequent suicidal ideation with limited intensity, and duration, some specificity in terms of plans, no associated intent, good self-control, limited dysphoria/symptomatology, some risk factors present, and identifiable protective factors, including available and accessible social support.  PLAN OF CARE: Patient is seen and examined.  Patient is a 57 year old male with the above-stated past psychiatric history who was admitted with suicidal ideation.  He is not a very good historian.  He is irritable and does not want to discuss anything.  He does not believe the medications help.  He stated he is been suicidal for years.  The reason why he was admitted was because he got angry at his mother.  He stated he had not taken his medications in 3 days prior to admission.  He has been previously treated recently with Remeron, Seroquel and Risperdal.  He does not recall ever having been treated with lithium, Depakote, Tegretol.  Review of the electronic medical record shows that he has been recently worked up for thrombocytopenia and an elevated blood sugar.  The only medication he admits to is that he is taking something for reflux disease.  He also has a history of obstructive sleep apnea.  [facility] 175,000.  He will be admitted to  the psychiatric hospital.  We will collect collateral information from his family if at all possible (if he allows us to).  I will start him on low-dose Saphris 5 mg p.o. twice daily and titrate this.  I considered a mood stabilizer, but if he is having problems with his platelets that might interfere with that.  We will also try and contact his psychiatrist on Monday to get collateral information.  I certify that inpatient services furnished can reasonably be expected to improve the patient's condition.   Antonieta PertGreg Lawson Soyla Bainter, MD 08/01/2018, 3:00 PM

## 2018-08-01 NOTE — H&P (Signed)
Psychiatric Admission Assessment Adult  Patient Identification: Devin Joseph MRN:  [id number] Date of Evaluation:  08/01/2018 Chief Complaint:  MDD,rec,sev Principal Diagnosis: <principal problem not specified> Diagnosis:   Patient Active Problem List   Diagnosis Date Noted  . MDD (major depressive disorder), severe (HCC) [F32.2] 08/01/2018  . MDD (major depressive disorder), recurrent severe, without psychosis (HCC) [F33.2] 11/25/2017  . Severe recurrent major depression without psychotic features (HCC) [F33.2] 09/10/2017   History of Present Illness: Patient is seen and examined.  Patient is a 57 year old male with a reported past psychiatric history significant for depression and borderline personality disorder who presented to the [facility] emergency department on 8/16 with suicidal ideation.  Also during his evaluation after he had been assessed by tele-psychiatry he became upset.  He was witnessed walking down the hallway, when he suddenly yelled out "oh my legs".  This was witnessed by multiple staff members and there is no seizure activity.  A CODE BLUE was initiated.  He was mildly hypertensive, his CT scan of the head showed minimal intracranial arterial vascular calcifications.  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  The patient was not a great historian and on admission stated that the reason why he became suicidal was because he got into an argument with his mother.  The mother apparently had asked him to assist in neighbor with some kind of work related issue.  This upset him greatly.  He stated that he is always suicidal.  He stated he is always been suicidal.  He stated his mother is live with them for approximately 12 years.  He is also married.  He has been seen by a psychiatrist [facility].  He seen them in the last several months.   Apparently he had stopped taking his medicines 2 to 3 days prior to this event.  He stated the medicines do not help, but he is also unsure of what he wants it to help with.  He has previously been on Risperdal, Celexa, Remeron.  He stated he was unable to remember any other medications he may have been treated with.  His last psychiatric hospitalization was at [facility] approximately 3 years ago.  He has told the staff that he does not want to have visitors from his family.  I am unsure whether we will be able to get collateral information from them.  He has a reported past medical history significant for gastroesophageal reflux disease and thrombocytopenia.  [facility] was 175,000.  He admitted he is still currently suicidal.  He stated that prior to the argument with his mother he had been stable. Associated Signs/Symptoms: Depression Symptoms:  depressed mood, anhedonia, insomnia, psychomotor agitation, fatigue, feelings of worthlessness/guilt, difficulty concentrating, hopelessness, suicidal thoughts without plan, anxiety, panic attacks, loss of energy/fatigue, disturbed sleep, (Hypo) Manic Symptoms:  Impulsivity, Irritable Mood, Labiality of Mood, Anxiety Symptoms:  Excessive Worry, Psychotic Symptoms:  Denied PTSD Symptoms: Negative Total Time spent with patient: 30 minutes  Past Psychiatric History: Patient stated he is been admitted to the psychiatric hospital on 3 or 4 occasions.  His last psychiatric hospitalization was at [facility] 3 years ago.  He is followed by a psychiatrist within the [facility].  He saw them within the last 2 months.  He has been previously treated with Celexa, Remeron,  Risperdal.  He is unsure of any other medications.  Is the patient at risk to self? Yes.    Has the patient been a risk to self in the past 6 months? Yes.    Has the patient been a risk to self within the distant past? Yes.    Is  the patient a risk to others? No.  Has the patient been a risk to others in the past 6 months? No.  Has the patient been a risk to others within the distant past? No.   Prior Inpatient Therapy:   Prior Outpatient Therapy:    Alcohol Screening:   Substance Abuse History in the last 12 months:  No. Consequences of Substance Abuse: Negative Previous Psychotropic Medications: Yes  Psychological Evaluations: Yes  Past Medical History:  Past Medical History:  Diagnosis Date  . Depression   . Suicidal behavior without attempted self-injury   . Thyroid disease     Past Surgical History:  Procedure Laterality Date  . CHOLECYSTECTOMY    . HERNIA REPAIR     Family History: No family history on file. Family Psychiatric  History: Denied Tobacco Screening:   Social History:  Social History   Substance and Sexual Activity  Alcohol Use No     Social History   Substance and Sexual Activity  Drug Use No    Additional Social History:                           Allergies:   Allergies  Allergen Reactions  . Trazodone And Nefazodone Other (See Comments)    Causes muscle jerks/tremors per spouse  . Latex Rash    NO POWDERED GLOVES, please!!   Lab Results: No results found for this or any previous visit (from the past 48 hour(s)).  Blood Alcohol level:  Lab Results  Component Value Date   ETH <10 11/26/2017   ETH <10 11/22/2017    Metabolic Disorder Labs:  Lab Results  Component Value Date   HGBA1C 5.7 (H) 11/27/2017   MPG 116.89 11/27/2017   No results found for: PROLACTIN Lab Results  Component Value Date   CHOL 154 11/27/2017   TRIG 206 (H) 11/27/2017   HDL 27 (L) 11/27/2017   CHOLHDL 5.7 11/27/2017   VLDL 41 (H) 11/27/2017   LDLCALC 86 11/27/2017    Current Medications: Current Facility-Administered Medications  Medication Dose Route Frequency Provider Last Rate Last Dose  . acetaminophen (TYLENOL) tablet 650 mg  650 mg Oral Q6H PRN Money, Gerlene Burdockravis  B, FNP      . alum & mag hydroxide-simeth (MAALOX/MYLANTA) 200-200-20 MG/5ML suspension 30 mL  30 mL Oral Q4H PRN Money, Gerlene Burdockravis B, FNP      . asenapine (SAPHRIS) sublingual tablet 5 mg  5 mg Sublingual BID Antonieta Pertlary, Xavius Spadafore Lawson, MD      . hydrOXYzine (ATARAX/VISTARIL) tablet 25 mg  25 mg Oral TID PRN Money, Gerlene Burdockravis B, FNP      . magnesium hydroxide (MILK OF MAGNESIA) suspension 30 mL  30 mL Oral Daily PRN Money, Gerlene Burdockravis B, FNP      . pantoprazole (PROTONIX) EC tablet 40 mg  40 mg Oral Daily Antonieta Pertlary, Mayra Brahm Lawson, MD       PTA Medications: Medications Prior to Admission  Medication Sig Dispense Refill Last Dose  . atorvastatin (LIPITOR) 40 MG tablet Take 1 tablet (40 mg total) by mouth at bedtime. (Patient taking differently: Take 20 mg by mouth at bedtime. )  30 tablet 0 Past Week at Unknown time  . citalopram (CELEXA) 20 MG tablet Take 1 tablet (20 mg total) by mouth daily. 30 tablet 0   . levothyroxine (SYNTHROID, LEVOTHROID) 112 MCG tablet Take 1 tablet (112 mcg total) by mouth daily before breakfast. For hypothyroidism   11/21/2017 at am  . mirtazapine (REMERON) 15 MG tablet Take 1 tablet (15 mg total) by mouth at bedtime. 30 tablet 0   . risperiDONE (RISPERDAL) 1 MG tablet Take 1 tablet (1 mg total) by mouth at bedtime. 30 tablet 0     Musculoskeletal: Strength & Muscle Tone: within normal limits Gait & Station: normal Patient leans: N/A  Psychiatric Specialty Exam: Physical Exam  Nursing note and vitals reviewed. Constitutional: He is oriented to person, place, and time. He appears well-developed and well-nourished.  HENT:  Head: Normocephalic and atraumatic.  Respiratory: Effort normal.  Neurological: He is alert and oriented to person, place, and time.    ROS  Blood pressure (P) 107/70, pulse (P) 81, temperature (P) 98 F (36.7 C), temperature source (P) Oral, resp. rate (P) 12, height (P) 5\' 4"  (1.626 m), weight (P) 82.6 kg, SpO2 (P) 98 %.Body mass index is 31.24 kg/m (pended).   General Appearance: Casual  Eye Contact:  Poor  Speech:  Normal Rate  Volume:  Increased  Mood:  Irritable  Affect:  Congruent  Thought Process:  Coherent and Descriptions of Associations: Intact  Orientation:  Full (Time, Place, and Person)  Thought Content:  Logical  Suicidal Thoughts:  Yes.  without intent/plan  Homicidal Thoughts:  No  Memory:  Immediate;   Poor Recent;   Poor Remote;   Poor  Judgement:  Impaired  Insight:  Lacking  Psychomotor Activity:  Increased  Concentration:  Concentration: Fair and Attention Span: Fair  Recall:  Poor  Fund of Knowledge:  Fair  Language:  Fair  Akathisia:  Negative  Handed:  Right  AIMS (if indicated):     Assets:  Desire for Improvement Physical Health Resilience  ADL's:  Intact  Cognition:  WNL  Sleep:       Treatment Plan Summary: Daily contact with patient to assess and evaluate symptoms and progress in treatment, Medication management and Plan : Patient is seen and examined.  Patient is a 57 year old male with the above-stated past psychiatric history who was admitted to our facility with suicidal ideation.  He is not a very good historian.  He is irritable and really does not want to discuss anything.  He does not believe the medications help.  He stated he has been suicidal for years.  The reason why he was admitted was because he got angry at his mother.  He stated he has not taken his medications in 3 days prior to admission.  He has previously been treated with Remeron, Celexa, and risk for doll.  He was unable to remember any other medications.  He stated he could not recall if he is ever been treated with lithium, Depakote or Tegretol.  Review of the electronic medical record shows that he has been recently worked up for thrombocytopenia and as well as had elevated blood sugar.  His only medication he admits to is that he is taking something for reflux disease.  He also has a history of obstructive sleep apnea.  His platelet  count at [facility] was 175,000.  He will be admitted to the psychiatric hospital.  We will collect collateral information from his family if at all possible (if  he allows [place] to).  I will start him on Saphris 5 mg p.o. twice daily and titrate this.  I hope this offers some degree of mood stability.  I considered a mood stabilizer like lithium, Tegretol or Depakote, but with his problems with platelets that might interfere with that.  We will also try to contact his outpatient psychiatrist on Monday and get collateral information.  Observation Level/Precautions:  15 minute checks Seizure  Laboratory:  Chemistry Profile  Psychotherapy:    Medications:    Consultations:    Discharge Concerns:    Estimated LOS:  Other:     Physician Treatment Plan for Primary Diagnosis: <principal problem not specified> Long Term Goal(s): Improvement in symptoms so as ready for discharge  Short Term Goals: Ability to identify changes in lifestyle to reduce recurrence of condition will improve, Ability to verbalize feelings will improve, Ability to disclose and discuss suicidal ideas, Ability to demonstrate self-control will improve, Ability to identify and develop effective coping behaviors will improve and Ability to maintain clinical measurements within normal limits will improve  Physician Treatment Plan for Secondary Diagnosis: Active Problems:   MDD (major depressive disorder), severe (HCC)  Long Term Goal(s): Improvement in symptoms so as ready for discharge  Short Term Goals: Ability to identify changes in lifestyle to reduce recurrence of condition will improve, Ability to verbalize feelings will improve, Ability to disclose and discuss suicidal ideas, Ability to demonstrate self-control will improve, Ability to identify and develop effective coping behaviors will improve and Ability to maintain clinical measurements within normal limits will improve  I certify that inpatient services furnished can  reasonably be expected to improve the patient's condition.    Antonieta Pert, MD 8/17/20193:12 PM

## 2018-08-01 NOTE — Progress Notes (Signed)
[facility]. Pt reports being depressed constantly but is very vague as to why he is here. Pt is a 57 yo male who reports having si with a plan to walk into traffic. Pt states he has auditory hallucinations but can't describe what they are saying. "it sounds like grumbling". Pt denies any sexual/physical/verbal abuse either past or present. Pt states he is here because his mother "just wants to smoke. She is smoking herself to death". Pt is a poor historian and vague as to how/why he came to behavioral health. Pt denies any substance abuse including alcohol. Pt is noncompliant with his medication, stating he stopped taking these a couple of weeks ago. Pt fell this past Thursday at his home making him a high fall risk. Pt complains of catatonia, but says he's not worried about it and doesn't want to take medications for this. Pt states his biggest support system is his wife, but stated he hasn't and will not contact her while he is here. Pt declined to place anyone on his call sheet. Pt claims he is retired, having worked at [employer] in the past. Pt declines to work on anything while he is here. Pt states he has both a dentist and PCP but doesn't know there name. "you can ask my wife, she knows everything". Pt states he cannot contract for safety "because I don't know what I'm going to do one minute to the next". Patty RN made aware.  Consents signed, skin/belongings search completed and patient oriented to unit. Patient stable at this time. Patient given the opportunity to express concerns and ask questions. Patient given toiletries. Will continue to monitor.

## 2018-08-01 NOTE — Plan of Care (Signed)
  Problem: Education: Goal: Knowledge of [facility] Education information/materials will improve Outcome: Not Progressing   

## 2018-08-01 NOTE — Tx Team (Signed)
Initial Treatment Plan 08/01/2018 1:39 PM Devin MemsJames D Woolever [id number]    PATIENT STRESSORS: Marital or family conflict Medication change or noncompliance Occupational concerns   PATIENT STRENGTHS: Barrister's clerkCommunication skills Motivation for treatment/growth Supportive family/friends   PATIENT IDENTIFIED PROBLEMS: "nothing"  Pt declined                   DISCHARGE CRITERIA:  Ability to meet basic life and health needs Adequate post-discharge living arrangements Motivation to continue treatment in a less acute level of care  PRELIMINARY DISCHARGE PLAN: Return to previous living arrangement  PATIENT/FAMILY INVOLVEMENT: This treatment plan has been presented to and reviewed with the patient, Devin Joseph.  The patient and family have been given the opportunity to ask questions and make suggestions.  Raylene MiyamotoMichael R Rashawd Laskaris, RN 08/01/2018, 1:39 PM

## 2018-08-02 DIAGNOSIS — R45851 Suicidal ideations: Secondary | ICD-10-CM | POA: Diagnosis not present

## 2018-08-02 DIAGNOSIS — F445 Conversion disorder with seizures or convulsions: Secondary | ICD-10-CM | POA: Diagnosis not present

## 2018-08-02 DIAGNOSIS — K219 Gastro-esophageal reflux disease without esophagitis: Secondary | ICD-10-CM | POA: Diagnosis not present

## 2018-08-02 DIAGNOSIS — F410 Panic disorder [episodic paroxysmal anxiety] without agoraphobia: Secondary | ICD-10-CM | POA: Diagnosis not present

## 2018-08-02 DIAGNOSIS — E7800 Pure hypercholesterolemia, unspecified: Secondary | ICD-10-CM | POA: Diagnosis not present

## 2018-08-02 DIAGNOSIS — Z87891 Personal history of nicotine dependence: Secondary | ICD-10-CM | POA: Diagnosis not present

## 2018-08-02 DIAGNOSIS — G4700 Insomnia, unspecified: Secondary | ICD-10-CM | POA: Diagnosis not present

## 2018-08-02 DIAGNOSIS — Z79899 Other long term (current) drug therapy: Secondary | ICD-10-CM | POA: Diagnosis not present

## 2018-08-02 DIAGNOSIS — F332 Major depressive disorder, recurrent severe without psychotic features: Secondary | ICD-10-CM | POA: Diagnosis present

## 2018-08-02 DIAGNOSIS — F603 Borderline personality disorder: Secondary | ICD-10-CM | POA: Diagnosis not present

## 2018-08-02 DIAGNOSIS — E039 Hypothyroidism, unspecified: Secondary | ICD-10-CM | POA: Diagnosis not present

## 2018-08-02 LAB — TSH: TSH: 1.109 u[IU]/mL (ref 0.350–4.500)

## 2018-08-02 NOTE — [facility] Group Notes (Signed)
[facility] LCSW Group Therapy Note  08/02/2018  10:00-11:00AM  Type of Therapy and Topic:  Group Therapy:  Being Your Own Support  Participation Level:  Did Not Attend   Description of Group:  Patients in this group were introduced to the concept that self-support is an essential part of recovery.  A song entitled "My Own Hero" was played and a group discussion ensued in which patients stated they could relate to the song and it inspired them to realize they have be willing to help themselves in order to succeed, because other people cannot achieve sobriety or stability for them.  We discussed adding a variety of healthy supports to address the various needs in their lives.  A song was played called "I Know Where I've Been" toward the end of group and used to conduct an inspirational wrap-up to group of remembering how far they have already come in their journey.  Therapeutic Goals: 1)  demonstrate the importance of being a part of one's own support system 2)  discuss reasons people in one's life may eventually be unable to be continually supportive  3)  identify the patient's current support system and   4)  elicit commitments to add healthy supports and to become more conscious of being self-supportive   Summary of Patient Progress:  N/A - in the quiet room during group   Therapeutic Modalities:   Motivational Interviewing Activity  Devin Joseph

## 2018-08-02 NOTE — Progress Notes (Signed)
1:1 Nursing note - Patient currently lying in the padded room asleep on the mattress. No distress noted and sitter at patients side. 1:1 continues and patient is safe.

## 2018-08-02 NOTE — Progress Notes (Signed)
[facility] MD Progress Note  08/02/2018 11:33 AM Devin MemsJames D Joseph  MRN:  [id number] Subjective: Patient is seen and examined.  Patient is a 57 year old male with a past psychiatric history significant for depression and borderline personality disorder.  He seen in follow-up.  Last night he was agitated, and would not contract for safety.  He stated he was going to choke himself.  He ended up having to have Haldol, Ativan and Benadryl.  He is currently asleep in the quiet room.  He was easily arousable, but asleep.  He remains also on Saphris 5 mg twice daily as well as Protonix 40 mg a day. Principal Problem: <principal problem not specified> Diagnosis:   Patient Active Problem List   Diagnosis Date Noted  . MDD (major depressive disorder), severe (HCC) [F32.2] 08/01/2018  . Borderline personality disorder (HCC) [F60.3]   . MDD (major depressive disorder), recurrent severe, without psychosis (HCC) [F33.2] 11/25/2017  . Severe recurrent major depression without psychotic features (HCC) [F33.2] 09/10/2017   Total Time spent with patient: 15 minutes  Past Psychiatric History: The admission H&P  Past Medical History:  Past Medical History:  Diagnosis Date  . Depression   . Suicidal behavior without attempted self-injury   . Thyroid disease     Past Surgical History:  Procedure Laterality Date  . CHOLECYSTECTOMY    . HERNIA REPAIR     Family History: History reviewed. No pertinent family history. Family Psychiatric  History: See admission H&P Social History:  Social History   Substance and Sexual Activity  Alcohol Use No     Social History   Substance and Sexual Activity  Drug Use No    Social History   Socioeconomic History  . Marital status: Married    Spouse name: Not on file  . Number of children: Not on file  . Years of education: Not on file  . Highest education level: Not on file  Occupational History  . Not on file  Social Needs  . Financial resource strain: Not on  file  . Food insecurity:    Worry: Not on file    Inability: Not on file  . Transportation needs:    Medical: Not on file    Non-medical: Not on file  Tobacco Use  . Smoking status: Former Games developermoker  . Smokeless tobacco: Never Used  Substance and Sexual Activity  . Alcohol use: No  . Drug use: No  . Sexual activity: Yes    Birth control/protection: None  Lifestyle  . Physical activity:    Days per week: Not on file    Minutes per session: Not on file  . Stress: Not on file  Relationships  . Social connections:    Talks on phone: Not on file    Gets together: Not on file    Attends religious service: Not on file    Active member of club or organization: Not on file    Attends meetings of clubs or organizations: Not on file    Relationship status: Not on file  Other Topics Concern  . Not on file  Social History Narrative  . Not on file   Additional Social History:                         Sleep: Good  Appetite:  Fair  Current Medications: Current Facility-Administered Medications  Medication Dose Route Frequency Provider Last Rate Last Dose  . acetaminophen (TYLENOL) tablet 650 mg  650 mg  Oral Q6H PRN Money, Gerlene Burdockravis B, FNP      . alum & mag hydroxide-simeth (MAALOX/MYLANTA) 200-200-20 MG/5ML suspension 30 mL  30 mL Oral Q4H PRN Money, Gerlene Burdockravis B, FNP      . asenapine (SAPHRIS) sublingual tablet 5 mg  5 mg Sublingual BID Antonieta Pertlary, Greg Lawson, MD      . hydrOXYzine (ATARAX/VISTARIL) tablet 25 mg  25 mg Oral TID PRN Money, Gerlene Burdockravis B, FNP      . magnesium hydroxide (MILK OF MAGNESIA) suspension 30 mL  30 mL Oral Daily PRN Money, Gerlene Burdockravis B, FNP      . pantoprazole (PROTONIX) EC tablet 40 mg  40 mg Oral Daily Antonieta Pertlary, Greg Lawson, MD        Lab Results: No results found for this or any previous visit (from the past 48 hour(s)).  Blood Alcohol level:  Lab Results  Component Value Date   ETH <10 11/26/2017   ETH <10 11/22/2017    Metabolic Disorder Labs: Lab Results   Component Value Date   HGBA1C 5.7 (H) 11/27/2017   MPG 116.89 11/27/2017   No results found for: PROLACTIN Lab Results  Component Value Date   CHOL 154 11/27/2017   TRIG 206 (H) 11/27/2017   HDL 27 (L) 11/27/2017   CHOLHDL 5.7 11/27/2017   VLDL 41 (H) 11/27/2017   LDLCALC 86 11/27/2017    Physical Findings: AIMS: Facial and Oral Movements Muscles of Facial Expression: None, normal Lips and Perioral Area: None, normal Jaw: None, normal Tongue: None, normal,Extremity Movements Upper (arms, wrists, hands, fingers): None, normal Lower (legs, knees, ankles, toes): None, normal, Trunk Movements Neck, shoulders, hips: None, normal, Overall Severity Severity of abnormal movements (highest score from questions above): None, normal Incapacitation due to abnormal movements: None, normal Patient's awareness of abnormal movements (rate only patient's report): No Awareness, Dental Status Current problems with teeth and/or dentures?: No Does patient usually wear dentures?: No  CIWA:    COWS:     Musculoskeletal: Strength & Muscle Tone: decreased Gait & Station: normal Patient leans: N/A  Psychiatric Specialty Exam: Physical Exam  Nursing note and vitals reviewed. Constitutional: He appears well-developed and well-nourished.  HENT:  Head: Normocephalic and atraumatic.  Respiratory: Effort normal.    ROS  Blood pressure 100/78, pulse 84, temperature 98 F (36.7 C), temperature source Oral, resp. rate 12, height 5\' 4"  (1.626 m), weight 82.6 kg, SpO2 98 %.Body mass index is 31.24 kg/m.  General Appearance: Disheveled  Eye Contact:  Minimal  Speech:  Slow  Volume:  Decreased  Mood:  Sedated  Affect:  Congruent  Thought Process:  Linear and Descriptions of Associations: Circumstantial  Orientation:  Negative  Thought Content:  NA  Suicidal Thoughts:  Yes.  without intent/plan  Homicidal Thoughts:  No  Memory:  Immediate;   Poor Recent;   Poor Remote;   Poor  Judgement:   Impaired  Insight:  Lacking  Psychomotor Activity:  Decreased and Psychomotor Retardation  Concentration:  Concentration: Poor and Attention Span: Poor  Recall:  Poor  Fund of Knowledge:  Poor  Language:  Fair  Akathisia:  Negative  Handed:  Right  AIMS (if indicated):     Assets:  Desire for Improvement Housing Physical Health  ADL's:  Intact  Cognition:  WNL  Sleep:  Number of Hours: 6.75     Treatment Plan Summary: Daily contact with patient to assess and evaluate symptoms and progress in treatment, Medication management and Plan : Patient is seen and examined.  Patient is a 57 year old male with the above-stated past psychiatric history seen in follow-up.  He was agitated last night, and ended up having to get Haldol, Ativan and Benadryl.  He is currently asleep, but arousable.  We will have to reassess him later today when he wakes up.  I will leave the Saphris as is for now.  Hopefully his agitation will be decreased upon awakening.  His vital signs are stable, he is afebrile.  No other changes at this point.  Antonieta Pert, MD 08/02/2018, 11:33 AM

## 2018-08-02 NOTE — [facility] Counselor (Signed)
Adult Comprehensive Assessment  Patient ID: Ardelia MemsJames D Bonser, male   DOB: August 09, 1961, 57 y.o.   MRN: [id number]  Information Source: Information source: Patient  Current Stressors:  Patient states their primary concerns and needs for treatment are:: no clue Patient states their goals for this hospitilization and ongoing recovery are:: none Educational / Learning stressors: no Employment / Job issues: no Family Relationships: no Surveyor, quantityinancial / Lack of resources (include bankruptcy): no Housing / Lack of housing: no Physical health (include injuries & life threatening diseases): no Social relationships: no Substance abuse: no Bereavement / Loss: no  Living/Environment/Situation:  Living Arrangements: Spouse/significant other(mother) Living conditions (as described by patient or guardian): mother and me argue Who else lives in the home?: see above How long has patient lived in current situation?: 13 years What is atmosphere in current home: Chaotic(Constant arguing)  Family History:  Marital status: Married Number of Years Married: 13 What types of issues is patient dealing with in the relationship?: all we do is argue Are you sexually active?: No Does patient have children?: Yes(56 year old daughter) How many children?: 1 How is patient's relationship with their children?: not close never see her  Childhood History:  By whom was/is the patient raised?: Mother Description of patient's relationship with caregiver when they were a child: terrible Patient's description of current relationship with people who raised him/her: terrible How were you disciplined when you got in trouble as a child/adolescent?: rough Does patient have siblings?: Yes Number of Siblings: 3 Description of patient's current relationship with siblings: close to some of them Did patient suffer any verbal/emotional/physical/sexual abuse as a child?: No Did patient suffer from severe childhood neglect?: No Has  patient ever been sexually abused/assaulted/raped as an adolescent or adult?: No Was the patient ever a victim of a crime or a disaster?: No Witnessed domestic violence?: No Has patient been effected by domestic violence as an adult?: No  Education:  Highest grade of school patient has completed: 12th grade Currently a student?: No Learning disability?: Yes What learning problems does patient have?: not sure math, reading  Employment/Work Situation:   Employment situation: On disability Why is patient on disability: no answer given How long has patient been on disability: 22 years Did You Receive Any Psychiatric Treatment/Services While in the [employer]?: No Are There Guns or Other Weapons in Your Home?: Yes Types of Guns/Weapons: just knives Are These Weapons Safely Secured?: Yes  Financial Resources:   Financial resources: Insurance claims handlereceives SSDI Does patient have a Lawyerrepresentative payee or guardian?: No  Alcohol/Substance Abuse:   What has been your use of drugs/alcohol within the last 12 months?: none  If attempted suicide, did drugs/alcohol play a role in this?: No Alcohol/Substance Abuse Treatment Hx: Denies past history Has alcohol/substance abuse ever caused legal problems?: No  Social Support System:   Conservation officer, natureatient's Community Support System: Fair Museum/gallery exhibitions officerDescribe Community Support System: wife Type of faith/religion: no How does patient's faith help to cope with current illness?: n/a  Leisure/Recreation:   Leisure and Hobbies: play video games  Strengths/Needs:   What is the patient's perception of their strengths?: none Patient states they can use these personal strengths during their treatment to contribute to their recovery: n/a Patient states these barriers may affect/interfere with their treatment: none Patient states these barriers may affect their return to the community: none  Discharge Plan:   Currently receiving community mental health services: No Patient states they will  know when they are safe and ready for discharge when: no  Does patient have access to transportation?: No Does patient have financial barriers related to discharge medications?: No Will patient be returning to same living situation after discharge?: (not sure he is returning home)  Summary/Recommendations:   Summary and Recommendations (to be completed by the evaluator):  Patient is a 57 year old male with a reported past psychiatric history significant for depression and borderline personality disorder who [facility] on [facility]/16 with suicidal ideation.  Primary stressor is his relationship with his mother. He stated his mother is live with them for approximately 12 years and they argue constantly. Patient is not always medication compliant and had stopped taking his medicines 2 to 3 days prior to this event.  Describing the medicines as not helping him.  He stated that prior to the argument with his mother he had been stable. Patient will benefit from crisis stabilization, medication evaluation, group therapy and psychoeducation, in addition to case management for discharge planning. At discharge it is recommended that Patient adhere to the established discharge plan and continue in treatment.  Evorn Gongonnie D Marlen Koman. 08/02/2018

## 2018-08-02 NOTE — Plan of Care (Signed)
  Problem: Education: Goal: Knowledge of [facility] Education information/materials will improve Outcome: Not Progressing   

## 2018-08-02 NOTE — Progress Notes (Signed)
Writer entered patients room and observed him lying in bed and he appeared to be asleep. Writer called his name and he opened his eyes. Writer informed him of medication scheduled and encouraged him to have a snack and come to the dayroom for a little while instead of staying in his room. He inquired about his glasses which Clinical research associatewriter gave to him. He reports suicidal thoughts and his sitter is at his side. He was also encouraged to shower and have his clothes washed and change into scrubs while this is being done. Safety maintained on unit. Will continue to monitor.

## 2018-08-02 NOTE — [facility] Counselor (Deleted)
Clinical Social Work Note  Psychosocial Assessment not possible at this time due to patient's drowsiness and agitation, need to be in the quiet room.  CSW will follow up tomorrow.  Ambrose MantleMareida Grossman-Orr, LCSW 08/02/2018, 3:48 PM

## 2018-08-02 NOTE — Progress Notes (Signed)
1:1 Nursing note- Patient is asleep with eyes closed and respirations even, no distress noted. 1:1 continues with sitter at patients side. Will continue to monitor.

## 2018-08-02 NOTE — Progress Notes (Signed)
1:1 Nursing note- Patient is currently lying in bed asleep with eyes closed and respirations even, no distress noted. 1:1 continues and patient is safe with sitter at bedside.

## 2018-08-02 NOTE — [facility] Group Notes (Signed)
[facility] Group Notes:  (Nursing/MHT/Case Management/Adjunct)  Date:  08/02/2018  Time:  1:15 pm  Type of Therapy:  Psychoeducational Skills  Participation Level:  Did Not Attend  Participation Quality:    Affect:    Cognitive:    Insight:    Engagement in Group:    Modes of Intervention:    Summary of Progress/Problems:  Earline MayotteKnight, Kendallyn Lippold Shephard 08/02/2018, 2:48 PM

## 2018-08-02 NOTE — Progress Notes (Signed)
D Pt remains suspicious, non communicative with this staff member and asleep in his bed.     A He has slept since taking his morning meds ( late at 1300) and eating his lunch and is currently asleep now.     R MHT is at his side.

## 2018-08-02 NOTE — Progress Notes (Signed)
D Pt is observed sleeping in is bed- in his room. He woke up ( he was sleping in ht equiet room) he said he wanted to return to his room and he did- and then he crawled into  his bed and went back to sleep. Writer gave him his 0800 doses of sphris and protonix in his room at approx 1300. Pt was served lunch in his room at 1300, he ate and returned to sleep.   A HE did  answer the questions on his daily assessment , saying he  was having feelings of SI today and he rated his depression and hopelessness and anxiety " 09/24/09", respectively. He refused to engage in any type of conversation with this Clinical research associatewriter .    R 1:1 in place and safety maintianed.

## 2018-08-02 NOTE — Progress Notes (Signed)
-    1:1 D Pt is observed in the quiet room. He is lying on the floor sleeping soundly. He is visibly comfortable, he is relaxed. His breathing is relaxed and normal. His facial expression is calm.     A 1:1 MHT is at patient's side as he sleeps.    R . Safety is in place.

## 2018-08-02 NOTE — Progress Notes (Signed)
Pateint placed on a 1:1 after not being able to contract for safety and reporting that he was going to choke himself after calling to the front desk stating that he was going to harm himself. Note started on 1:1 board for sitter.

## 2018-08-03 DIAGNOSIS — F322 Major depressive disorder, single episode, severe without psychotic features: Secondary | ICD-10-CM | POA: Diagnosis not present

## 2018-08-03 DIAGNOSIS — F603 Borderline personality disorder: Secondary | ICD-10-CM | POA: Diagnosis not present

## 2018-08-03 MED ORDER — BACITRACIN-NEOMYCIN-POLYMYXIN 400-5-5000 EX OINT: TOPICAL_OINTMENT | Freq: Two times a day (BID) | CUTANEOUS | Status: DC

## 2018-08-03 NOTE — Progress Notes (Signed)
[facility] Post 1:1 Observation Documentation  For the first (8) hours following discontinuation of 1:1 precautions, a progress note entry by nursing staff should be documented at least every 2 hours, reflecting the patient's behavior, condition, mood, and conversation.  Use the progress notes for additional entries.  Time 1:1 discontinued:  1024  Patient's Behavior: Awake in dayroom at intervals during groups. Engaged in activities with peers.  Patient's Condition:  Pt remains safe on and off unit without self harm gestures or outburst.   Patient's Conversation: "I'm doing fine right now, I just want to go outside".   Sherryl MangesWesseh, Roshawnda Pecora 08/03/2018, 1500

## 2018-08-03 NOTE — Progress Notes (Signed)
[facility] Post 1:1 Observation Documentation  For the first (8) hours following discontinuation of 1:1 precautions, a progress note entry by nursing staff should be documented at least every 2 hours, reflecting the patient's behavior, condition, mood, and conversation.  Use the progress notes for additional entries.  Time 1:1 discontinued:  1024  Patient's Behavior:  Calm, cooperative with care. Denies concerns at this time.   Patient's Condition:  Remains safe on and off unit without self harm gestures or outburst.  Patient's Conversation: "I went outside and I scratched myself on the nose but I'm alright".   Devin Joseph, Devin Joseph 08/03/2018, 1700

## 2018-08-03 NOTE — Progress Notes (Signed)
1:1 Nursing note - Patient is currently lying in bed asleep with eyes closed and respirations even no distress noted. Patient remains safe and 1:1 continues with sitter at bedside.

## 2018-08-03 NOTE — Progress Notes (Signed)
Nursing Progress Note: 7p-7a D: Pt currently presents with an animated/childlike affect and behavior. Pt states "I want to have a roommate it helps with my anxiety. I want everyone to go to cookout with all these guys (patients)" Interacting appropriately with the milieu. Pt reports good sleep during the previous night with current medication regimen. Pt did attend wrap-up group.  A: Pt provided with medications per providers orders. Pt's labs and vitals were monitored throughout the night. Pt supported emotionally and encouraged to express concerns and questions. Pt educated on medications.  R: Pt's safety ensured with 15 minute and environmental checks. Pt currently denies HI and AVH and endorses passive SI. Pt verbally contracts to seek staff if SI,HI, or AVH occurs and to consult with staff before acting on any harmful thoughts. Will continue to monitor.

## 2018-08-03 NOTE — Progress Notes (Signed)
[facility] MD Progress Note  08/03/2018 11:40 AM Devin Joseph  MRN:  [id number] Subjective: Patient is seen and examined.  Patient is a 57 year old male with a past psychiatric history significant for depression, borderline personality disorder, and some concern for intellectual disability.  He seen in follow-up.  Yesterday when I saw him he was in the quiet room.  He had had a period of agitation that led to him having to receive Haldol, Benadryl and Ativan.  During the day yesterday he was much more pleasant and cooperative.  He did start the Saphris yesterday, and he feels like it is beneficial.  And we will continue that.  He stated he felt safe enough with himself that we could stop the one-to-one. Principal Problem: <principal problem not specified> Diagnosis:   Patient Active Problem List   Diagnosis Date Noted  . MDD (major depressive disorder), severe (HCC) [F32.2] 08/01/2018  . Borderline personality disorder (HCC) [F60.3]   . MDD (major depressive disorder), recurrent severe, without psychosis (HCC) [F33.2] 11/25/2017  . Severe recurrent major depression without psychotic features (HCC) [F33.2] 09/10/2017   Total Time spent with patient: 15 minutes  Past Psychiatric History: The admission H&P  Past Medical History:  Past Medical History:  Diagnosis Date  . Depression   . Suicidal behavior without attempted self-injury   . Thyroid disease     Past Surgical History:  Procedure Laterality Date  . CHOLECYSTECTOMY    . HERNIA REPAIR     Family History: History reviewed. No pertinent family history. Family Psychiatric  History: See admission H&P Social History:  Social History   Substance and Sexual Activity  Alcohol Use No     Social History   Substance and Sexual Activity  Drug Use No    Social History   Socioeconomic History  . Marital status: Married    Spouse name: Not on file  . Number of children: Not on file  . Years of education: Not on file  . Highest  education level: Not on file  Occupational History  . Not on file  Social Needs  . Financial resource strain: Not on file  . Food insecurity:    Worry: Not on file    Inability: Not on file  . Transportation needs:    Medical: Not on file    Non-medical: Not on file  Tobacco Use  . Smoking status: Former Games developer  . Smokeless tobacco: Never Used  Substance and Sexual Activity  . Alcohol use: No  . Drug use: No  . Sexual activity: Yes    Birth control/protection: None  Lifestyle  . Physical activity:    Days per week: Not on file    Minutes per session: Not on file  . Stress: Not on file  Relationships  . Social connections:    Talks on phone: Not on file    Gets together: Not on file    Attends religious service: Not on file    Active member of club or organization: Not on file    Attends meetings of clubs or organizations: Not on file    Relationship status: Not on file  Other Topics Concern  . Not on file  Social History Narrative  . Not on file   Additional Social History:                         Sleep: Good  Appetite:  Good  Current Medications: Current Facility-Administered Medications  Medication Dose  Route Frequency Provider Last Rate Last Dose  . acetaminophen (TYLENOL) tablet 650 mg  650 mg Oral Q6H PRN Money, Gerlene Burdockravis B, FNP      . alum & mag hydroxide-simeth (MAALOX/MYLANTA) 200-200-20 MG/5ML suspension 30 mL  30 mL Oral Q4H PRN Money, Feliz Beamravis B, FNP      . asenapine (SAPHRIS) sublingual tablet 5 mg  5 mg Sublingual BID Antonieta Pertlary, Sieanna Vanstone Lawson, MD   5 mg at 08/03/18 0800  . hydrOXYzine (ATARAX/VISTARIL) tablet 25 mg  25 mg Oral TID PRN Money, Gerlene Burdockravis B, FNP      . magnesium hydroxide (MILK OF MAGNESIA) suspension 30 mL  30 mL Oral Daily PRN Money, Gerlene Burdockravis B, FNP      . pantoprazole (PROTONIX) EC tablet 40 mg  40 mg Oral Daily Antonieta Pertlary, Kalmen Lollar Lawson, MD   40 mg at 08/03/18 0800    Lab Results:  Results for orders placed or performed during the hospital  encounter of 08/01/18 (from the past 48 hour(s))  TSH     Status: None   Collection Time: 08/02/18  6:30 PM  Result Value Ref Range   TSH 1.109 0.350 - 4.500 uIU/mL    Comment: Performed by a 3rd Generation assay with a functional sensitivity of <=0.01 uIU/mL. Performed at [facility], [address] [place], [place] [address]     Blood Alcohol level:  Lab Results  Component Value Date   ETH <10 11/26/2017   ETH <10 11/22/2017    Metabolic Disorder Labs: Lab Results  Component Value Date   HGBA1C 5.7 (H) 11/27/2017   MPG 116.89 11/27/2017   No results found for: PROLACTIN Lab Results  Component Value Date   CHOL 154 11/27/2017   TRIG 206 (H) 11/27/2017   HDL 27 (L) 11/27/2017   CHOLHDL 5.7 11/27/2017   VLDL 41 (H) 11/27/2017   LDLCALC 86 11/27/2017    Physical Findings: AIMS: Facial and Oral Movements Muscles of Facial Expression: None, normal Lips and Perioral Area: None, normal Jaw: None, normal Tongue: None, normal,Extremity Movements Upper (arms, wrists, hands, fingers): None, normal Lower (legs, knees, ankles, toes): None, normal, Trunk Movements Neck, shoulders, hips: None, normal, Overall Severity Severity of abnormal movements (highest score from questions above): None, normal Incapacitation due to abnormal movements: None, normal Patient's awareness of abnormal movements (rate only patient's report): No Awareness, Dental Status Current problems with teeth and/or dentures?: No Does patient usually wear dentures?: No  CIWA:    COWS:     Musculoskeletal: Strength & Muscle Tone: within normal limits Gait & Station: normal Patient leans: N/A  Psychiatric Specialty Exam: Physical Exam  Nursing note and vitals reviewed. Constitutional: He is oriented to person, place, and time. He appears well-developed and well-nourished.  HENT:  Head: Normocephalic and atraumatic.  Respiratory: Effort normal.  Neurological: He is alert and  oriented to person, place, and time.    ROS  Blood pressure 117/78, pulse 87, temperature 99 F (37.2 C), temperature source Oral, resp. rate 16, height 5\' 4"  (1.626 m), weight 82.6 kg, SpO2 98 %.Body mass index is 31.24 kg/m.  General Appearance: Casual  Eye Contact:  Fair  Speech:  Normal Rate  Volume:  Normal  Mood:  Euthymic  Affect:  Congruent  Thought Process:  Coherent and Descriptions of Associations: Intact  Orientation:  Full (Time, Place, and Person)  Thought Content:  Logical  Suicidal Thoughts:  No  Homicidal Thoughts:  No  Memory:  Immediate;   Fair Recent;  Fair Remote;   Fair  Judgement:  Impaired  Insight:  Fair  Psychomotor Activity:  Increased  Concentration:  Concentration: Fair and Attention Span: Fair  Recall:  [employer]Fair  Fund of Knowledge:  Fair  Language:  Fair  Akathisia:  Negative  Handed:  Right  AIMS (if indicated):     Assets:  Desire for Improvement Financial Resources/Insurance Housing Physical Health Resilience Social Support  ADL's:  Intact  Cognition:  WNL  Sleep:  Number of Hours: 6.75     Treatment Plan Summary: Daily contact with patient to assess and evaluate symptoms and progress in treatment, Medication management and Plan : Patient is seen and examined.  Patient is a 57 year old male with the above-stated past psychiatric history seen in follow-up.  He is doing better on the Saphris.  We will continue the 5 mg p.o. twice daily.  No other changes medications.  I have stopped the one-to-one coverage today, and we will monitor him for any increase in suicidal ideation.  Vital signs are stable, he is afebrile.  Antonieta PertGreg Lawson Evangelos Paulino, MD 08/03/2018, 11:40 AM

## 2018-08-03 NOTE — Tx Team (Signed)
Interdisciplinary Treatment and Diagnostic Plan Update  08/03/2018 Time of Session: 0934 Devin Joseph MRN: [id number]  Principal Diagnosis: <principal problem not specified>  Secondary Diagnoses: Active Problems:   MDD (major depressive disorder), severe (HCC)   Borderline personality disorder (HCC)   Current Medications:  Current Facility-Administered Medications  Medication Dose Route Frequency Provider Last Rate Last Dose  . acetaminophen (TYLENOL) tablet 650 mg  650 mg Oral Q6H PRN Money, Gerlene Burdockravis B, FNP      . alum & mag hydroxide-simeth (MAALOX/MYLANTA) 200-200-20 MG/5ML suspension 30 mL  30 mL Oral Q4H PRN Money, Feliz Beamravis B, FNP      . asenapine (SAPHRIS) sublingual tablet 5 mg  5 mg Sublingual BID Antonieta Pertlary, Greg Lawson, MD   5 mg at 08/03/18 0800  . hydrOXYzine (ATARAX/VISTARIL) tablet 25 mg  25 mg Oral TID PRN Money, Gerlene Burdockravis B, FNP      . magnesium hydroxide (MILK OF MAGNESIA) suspension 30 mL  30 mL Oral Daily PRN Money, Gerlene Burdockravis B, FNP      . pantoprazole (PROTONIX) EC tablet 40 mg  40 mg Oral Daily Antonieta Pertlary, Greg Lawson, MD   40 mg at 08/03/18 0800   PTA Medications: Medications Prior to Admission  Medication Sig Dispense Refill Last Dose  . atorvastatin (LIPITOR) 40 MG tablet Take 1 tablet (40 mg total) by mouth at bedtime. (Patient taking differently: Take 20 mg by mouth at bedtime. ) 30 tablet 0 Past Week at Unknown time  . citalopram (CELEXA) 20 MG tablet Take 1 tablet (20 mg total) by mouth daily. 30 tablet 0   . levothyroxine (SYNTHROID, LEVOTHROID) 112 MCG tablet Take 1 tablet (112 mcg total) by mouth daily before breakfast. For hypothyroidism   11/21/2017 at am  . mirtazapine (REMERON) 15 MG tablet Take 1 tablet (15 mg total) by mouth at bedtime. 30 tablet 0   . risperiDONE (RISPERDAL) 1 MG tablet Take 1 tablet (1 mg total) by mouth at bedtime. 30 tablet 0     Patient Stressors: Marital or family conflict Medication change or noncompliance Occupational concerns  Patient  Strengths: Barrister's clerkCommunication skills Motivation for treatment/growth Supportive family/friends  Treatment Modalities: Medication Management, Group therapy, Case management,  1 to 1 session with clinician, Psychoeducation, Recreational therapy.   Physician Treatment Plan for Primary Diagnosis: <principal problem not specified> Long Term Goal(s): Improvement in symptoms so as ready for discharge Improvement in symptoms so as ready for discharge   Short Term Goals: Ability to identify changes in lifestyle to reduce recurrence of condition will improve Ability to verbalize feelings will improve Ability to disclose and discuss suicidal ideas Ability to demonstrate self-control will improve Ability to identify and develop effective coping behaviors will improve Ability to maintain clinical measurements within normal limits will improve Ability to identify changes in lifestyle to reduce recurrence of condition will improve Ability to verbalize feelings will improve Ability to disclose and discuss suicidal ideas Ability to demonstrate self-control will improve Ability to identify and develop effective coping behaviors will improve Ability to maintain clinical measurements within normal limits will improve  Medication Management: Evaluate patient's response, side effects, and tolerance of medication regimen.  Therapeutic Interventions: 1 to 1 sessions, Unit Group sessions and Medication administration.  Evaluation of Outcomes: Progressing  Physician Treatment Plan for Secondary Diagnosis: Active Problems:   MDD (major depressive disorder), severe (HCC)   Borderline personality disorder (HCC)  Long Term Goal(s): Improvement in symptoms so as ready for discharge Improvement in symptoms so as ready for discharge  Short Term Goals: Ability to identify changes in lifestyle to reduce recurrence of condition will improve Ability to verbalize feelings will improve Ability to disclose and discuss  suicidal ideas Ability to demonstrate self-control will improve Ability to identify and develop effective coping behaviors will improve Ability to maintain clinical measurements within normal limits will improve Ability to identify changes in lifestyle to reduce recurrence of condition will improve Ability to verbalize feelings will improve Ability to disclose and discuss suicidal ideas Ability to demonstrate self-control will improve Ability to identify and develop effective coping behaviors will improve Ability to maintain clinical measurements within normal limits will improve     Medication Management: Evaluate patient's response, side effects, and tolerance of medication regimen.  Therapeutic Interventions: 1 to 1 sessions, Unit Group sessions and Medication administration.  Evaluation of Outcomes: Progressing   RN Treatment Plan for Primary Diagnosis: <principal problem not specified> Long Term Goal(s): Knowledge of disease and therapeutic regimen to maintain health will improve  Short Term Goals: Ability to identify and develop effective coping behaviors will improve and Compliance with prescribed medications will improve  Medication Management: RN will administer medications as ordered by provider, will assess and evaluate patient's response and provide education to patient for prescribed medication. RN will report any adverse and/or side effects to prescribing provider.  Therapeutic Interventions: 1 on 1 counseling sessions, Psychoeducation, Medication administration, Evaluate responses to treatment, Monitor vital signs and CBGs as ordered, Perform/monitor CIWA, COWS, AIMS and Fall Risk screenings as ordered, Perform wound care treatments as ordered.  Evaluation of Outcomes: Progressing   LCSW Treatment Plan for Primary Diagnosis: <principal problem not specified> Long Term Goal(s): Safe transition to appropriate next level of care at discharge, Engage patient in therapeutic  group addressing interpersonal concerns.  Short Term Goals: Engage patient in aftercare planning with referrals and resources, Increase social support and Increase skills for wellness and recovery  Therapeutic Interventions: Assess for all discharge needs, 1 to 1 time with Social worker, Explore available resources and support systems, Assess for adequacy in community support network, Educate family and significant other(s) on suicide prevention, Complete Psychosocial Assessment, Interpersonal group therapy.  Evaluation of Outcomes: Progressing   Progress in Treatment: Attending groups: Yes. Participating in groups: Yes. Taking medication as prescribed: Yes. Toleration medication: Yes. Family/Significant other contact made: No, will contact:  pt declined consent Patient understands diagnosis: Yes. Discussing patient identified problems/goals with staff: Yes. Medical problems stabilized or resolved: Yes. Denies suicidal/homicidal ideation: Yes. Issues/concerns per patient self-inventory: No. Other: none  New problem(s) identified: No, Describe:  none  New Short Term/Long Term Goal(s):  Patient Goals:  To get well.  Be better on the outside of the hospital.  Discharge Plan or Barriers:   Reason for Continuation of Hospitalization: Depression Medication stabilization  Estimated Length of Stay: 2-4 days.   Attendees: Patient: Devin Joseph 08/03/2018   Physician: Dr. Jola Babinskilary, MD 08/03/2018   Nursing: Darcus Pesterlivette Wesech, RN 08/03/2018   RN Care Manager: 08/03/2018   Social Worker: Daleen SquibbGreg Trisha Morandi, LCSW 08/03/2018   Recreational Therapist:  08/03/2018   Other:  08/03/2018   Other:  08/03/2018   Other: 08/03/2018       Scribe for Treatment Team: Lorri FrederickWierda, Arabia Nylund Jon, LCSW 08/03/2018 3:45 PM

## 2018-08-03 NOTE — Progress Notes (Signed)
1:1 Nursing note (late entry) Patient is lying in bed asleep no distress noted. 1:1 continues with sitter at patients bedside.

## 2018-08-03 NOTE — Progress Notes (Signed)
Adult Psychoeducational Group Note  Date:  08/03/2018 Time:  1:00 AM  Group Topic/Focus:  Wrap-Up Group:   The focus of this group is to help patients review their daily goal of treatment and discuss progress on daily workbooks.  Participation Level:  Did Not Attend  Participation Quality:    Affect:   Cognitive:    Insight:   Engagement in Group:    Modes of Intervention:    Additional Comments:  Pt was invited to attend but declined. Dorathy Stallone 08/03/2018, 1:00 AM

## 2018-08-03 NOTE — [facility] Group Notes (Signed)
[facility] LCSW Group Therapy Note  Date/Time: 08/03/18, 1315  Type of Therapy/Topic:  Group Therapy:  Balance in Life  Participation Level:  moderate  Description of Group:    This group will address the concept of balance and how it feels and looks when one is unbalanced. Patients will be encouraged to process areas in their lives that are out of balance, and identify reasons for remaining unbalanced. Facilitators will guide patients utilizing problem- solving interventions to address and correct the stressor making their life unbalanced. Understanding and applying boundaries will be explored and addressed for obtaining  and maintaining a balanced life. Patients will be encouraged to explore ways to assertively make their unbalanced needs known to significant others in their lives, using other group members and facilitator for support and feedback.  Therapeutic Goals: 1. Patient will identify two or more emotions or situations they have that consume much of in their lives. 2. Patient will identify signs/triggers that life has become out of balance:  3. Patient will identify two ways to set boundaries in order to achieve balance in their lives:  4. Patient will demonstrate ability to communicate their needs through discussion and/or role plays  Summary of Patient Progress:Pt somewhat inappropriate in making comments out of turn during group.  Shared that his relationship with his mother is his obstacle.            Therapeutic Modalities:   Cognitive Behavioral Therapy Solution-Focused Therapy Assertiveness Training  Daleen SquibbGreg Kinzi Frediani, [place]CS[place]

## 2018-08-03 NOTE — Progress Notes (Signed)
Recreation Therapy Notes  Date: 8.19.19 Time: 0930 Location: [address] Dayroom  Group Topic: Stress Management  Goal Area(s) Addresses:  Patient will verbalize importance of using healthy stress management.  Patient will identify positive emotions associated with healthy stress management.   Intervention: Stress Management  Activity :  Guided Imagery.  LRT introduced the stress management technique of guided imagery.  LRT read a script that allowed patients to visualize their peaceful place.  Patients were to follow along as script was read.  Education: Stress Management, Discharge Planning.   Education Outcome: Acknowledges edcuation/In group clarification offered/Needs additional education  Clinical Observations/Feedback: Pt did not attend group.     Francheska Villeda, LRT/CTRS         Thaxton Pelley A 08/03/2018 12:44 PM 

## 2018-08-03 NOTE — Progress Notes (Signed)
RN 1:1 Note:  D: Pt A & O X3. Denies AVH, HI and pain. Endorsed passive SI when assessed; "I don't know about my plan right now, I will let you know". Verbally contracts for safety. Report he slept fair last night with good appetite, normal energy and good concentration level. Observed to be animated on interactions.  A: Scheduled medications given as ordered with verbal education and effects monitored. Encouraged pt to voice concerns. 1:1 observation continues with assigned staff in attendance at all times.  R: Pt has been compliant with medications when offered. Denies adverse drug reactions when assessed. Tolerates breakfast and fluids well. Visible in milieu at this time without self harm gestures or outburst to note thus far.

## 2018-08-03 NOTE — Progress Notes (Signed)
[facility] Post 1:1 Observation Documentation  For the first (8) hours following discontinuation of 1:1 precautions, a progress note entry by nursing staff should be documented at least every 2 hours, reflecting the patient's behavior, condition, mood, and conversation.  Use the progress notes for additional entries.  Time 1:1 discontinued:  1024  Patient's Behavior:  Awake in dayroom, verbally engaged with peers. Affect is bright.   Patient's Condition: stable, A & O to self, place and events.  Ambulatory in milieu with a steady gait. Off unit to courtyard with peers.  Patient's Conversation: "I'm doing fine right now". Denies SI, HI, AVH or pain at this time.    Sherryl MangesWesseh, Keyshia Orwick 08/03/2018, 1100AM

## 2018-08-03 NOTE — Progress Notes (Signed)
[facility] Post 1:1 Observation Documentation  For the first (8) hours following discontinuation of 1:1 precautions, a progress note entry by nursing staff should be documented at least every 2 hours, reflecting the patient's behavior, condition, mood, and conversation.  Use the progress notes for additional entries.  Time 1:1 discontinued:  1024  Patient's Behavior:  Calm, cooperative with care and unit routines. No self harm gestures noted thus far.  Patient's Condition: Denies physical complaints or concerns at this time.    Patient's Conversation: "I had lunch and now I just need to get some rest".    Sherryl MangesWesseh, Harveen Flesch 08/03/2018, 1300

## 2018-08-04 DIAGNOSIS — F322 Major depressive disorder, single episode, severe without psychotic features: Secondary | ICD-10-CM | POA: Diagnosis not present

## 2018-08-04 DIAGNOSIS — F603 Borderline personality disorder: Secondary | ICD-10-CM | POA: Diagnosis not present

## 2018-08-04 NOTE — Progress Notes (Signed)
Recreation Therapy Notes  Animal-Assisted Activity (AAA) Program Checklist/Progress Notes Patient Eligibility Criteria Checklist & Daily Group note for Rec TxIntervention  Date: 8.20.19 Time: 1430 [address]   AAA/T Program Assumption of Risk Form signed by Engineer, productionatient/ or Parent Legal Guardian YES   Patient is free of allergies or sever asthma YES   Patient reports no fear of animals  YES  Patient reports no history of cruelty to animals YES   Patient understands his/her participation is voluntary YES  Patient washes hands before animal contact YES   Patient washes hands after animal contact YES   Behavioral Response: Engaged  Education:Hand Washing, Appropriate Animal Interaction   Education Outcome: Acknowledges understanding/In group clarification offered/Needs additional education.   Clinical Observations/Feedback: Pt did not attend group activity.   Caroll RancherMarjette Malyssa Maris, LRT/CTRS         Caroll RancherLindsay, Soni Kegel A 08/04/2018 3:41 PM

## 2018-08-04 NOTE — Progress Notes (Signed)
Patient ID: Devin Joseph, male   DOB: March 01, 1961, 57 y.o.   MRN: [id number] D: Patient with depressed mood and blunted affect, encouraged to complete his self inventory shift at start of shift, but stated that he was too drowsy and sleepy to do so, and preferred to go back to bed.  Pt reported a poor appetite, and low energy level, and denies SI/HI/AVH.    A: Pt being given all meds as scheduled and is currently being maintained on Q15 minute checks for safety.  R: Will continue to monitor.

## 2018-08-04 NOTE — Progress Notes (Signed)
[facility] MD Progress Note  08/04/2018 3:27 PM Devin Joseph  MRN:  [id number] Subjective: patient reports he is feeling " all right " today. States he is feeling better than he did prior to admission and currently minimizes depression, states mood is " all right". Denies suicidal ideations, denies medication side effects. Objective : I have discussed case with treatment team and have met with patient. 57 year old married male, presented to ED on 8/16 due to suicidal ideations. It was felt he had a pseudoseizure while in ED. History of depression, history of prior psychiatric admissions, most recently  in December of 2018, for suicidal ideations. Patient reports he is doing better, and currently denies severe depression or neuro-vegetative symptoms of depression. With his express consent I have spoken with his wife , who corroborates patient is presenting with improvement. Patient [name] report that his  relationship with patient's mother has been a stressor.  He has had no further episodes of psychomotor agitation, no seizure like activity noted or reported . Currently presents calm, in no acute distress, cooperative on approach, no agitation. Denies medication side effects. Denies suicidal ideations.    Principal Problem: Depression Diagnosis:   Patient Active Problem List   Diagnosis Date Noted  . MDD (major depressive disorder), severe ([place]) [F32.2] 08/01/2018  . Borderline personality disorder ([place]) [F60.3]   . MDD (major depressive disorder), recurrent severe, without psychosis ([place]) [F33.2] 11/25/2017  . Severe recurrent major depression without psychotic features ([place]) [F33.2] 09/10/2017   Total Time spent with patient: 20 minutes  Past Psychiatric History: The admission H&P  Past Medical History:  Past Medical History:  Diagnosis Date  . Depression   . Suicidal behavior without attempted self-injury   . Thyroid disease     Past Surgical History:  Procedure Laterality Date  .  CHOLECYSTECTOMY    . HERNIA REPAIR     Family History: History reviewed. No pertinent family history. Family Psychiatric  History: See admission H&P Social History:  Social History   Substance and Sexual Activity  Alcohol Use No     Social History   Substance and Sexual Activity  Drug Use No    Social History   Socioeconomic History  . Marital status: Married    Spouse name: Not on file  . Number of children: Not on file  . Years of education: Not on file  . Highest education level: Not on file  Occupational History  . Not on file  Social Needs  . Financial resource strain: Not on file  . Food insecurity:    Worry: Not on file    Inability: Not on file  . Transportation needs:    Medical: Not on file    Non-medical: Not on file  Tobacco Use  . Smoking status: Former Research scientist (life sciences)  . Smokeless tobacco: Never Used  Substance and Sexual Activity  . Alcohol use: No  . Drug use: No  . Sexual activity: Yes    Birth control/protection: None  Lifestyle  . Physical activity:    Days per week: Not on file    Minutes per session: Not on file  . Stress: Not on file  Relationships  . Social connections:    Talks on phone: Not on file    Gets together: Not on file    Attends religious service: Not on file    Active member of club or organization: Not on file    Attends meetings of clubs or organizations: Not on file  Relationship status: Not on file  Other Topics Concern  . Not on file  Social History Narrative  . Not on file   Additional Social History:   Sleep: Fair  Appetite:  Good  Current Medications: Current Facility-Administered Medications  Medication Dose Route Frequency Provider Last Rate Last Dose  . acetaminophen (TYLENOL) tablet 650 mg  650 mg Oral Q6H PRN Money, Lowry Ram, FNP      . alum & mag hydroxide-simeth (MAALOX/MYLANTA) 200-200-20 MG/5ML suspension 30 mL  30 mL Oral Q4H PRN Money, Lowry Ram, FNP      . asenapine (SAPHRIS) sublingual tablet 5 mg   5 mg Sublingual BID Sharma Covert, MD   5 mg at 08/04/18 0801  . hydrOXYzine (ATARAX/VISTARIL) tablet 25 mg  25 mg Oral TID PRN Money, Lowry Ram, FNP   25 mg at 08/04/18 0118  . magnesium hydroxide (MILK OF MAGNESIA) suspension 30 mL  30 mL Oral Daily PRN Money, Lowry Ram, FNP      . neomycin-bacitracin-polymyxin (NEOSPORIN) ointment   Topical BID Sharma Covert, MD      . pantoprazole (PROTONIX) EC tablet 40 mg  40 mg Oral Daily Sharma Covert, MD   40 mg at 08/04/18 0800    Lab Results:  Results for orders placed or performed during the hospital encounter of 08/01/18 (from the past 48 hour(s))  TSH     Status: None   Collection Time: 08/02/18  6:30 PM  Result Value Ref Range   TSH 1.109 0.350 - 4.500 uIU/mL    Comment: Performed by a 3rd Generation assay with a functional sensitivity of <=0.01 uIU/mL. Performed at [facility], [place] [address] [place],  [address]     Blood Alcohol level:  Lab Results  Component Value Date   ETH <10 11/26/2017   ETH <10 03/75/4360    Metabolic Disorder Labs: Lab Results  Component Value Date   HGBA1C 5.7 (H) 11/27/2017   MPG 116.89 11/27/2017   No results found for: PROLACTIN Lab Results  Component Value Date   CHOL 154 11/27/2017   TRIG 206 (H) 11/27/2017   HDL 27 (L) 11/27/2017   CHOLHDL 5.7 11/27/2017   VLDL 41 (H) 11/27/2017   LDLCALC 86 11/27/2017    Physical Findings: AIMS: Facial and Oral Movements Muscles of Facial Expression: None, normal Lips and Perioral Area: None, normal Jaw: None, normal Tongue: None, normal,Extremity Movements Upper (arms, wrists, hands, fingers): None, normal Lower (legs, knees, ankles, toes): None, normal, Trunk Movements Neck, shoulders, hips: None, normal, Overall Severity Severity of abnormal movements (highest score from questions above): None, normal Incapacitation due to abnormal movements: None, normal Patient's awareness of abnormal movements (rate  only patient's report): No Awareness, Dental Status Current problems with teeth and/or dentures?: No Does patient usually wear dentures?: No  CIWA:    COWS:     Musculoskeletal: Strength & Muscle Tone: within normal limits Gait & Station: normal Patient leans: N/A  Psychiatric Specialty Exam: Physical Exam  Nursing note and vitals reviewed. Constitutional: He is oriented to person, place, and time. He appears well-developed and well-nourished.  HENT:  Head: Normocephalic and atraumatic.  Respiratory: Effort normal.  Neurological: He is alert and oriented to person, place, and time.    ROS denies chest pain, no shortness of breath, no vomiting   Blood pressure 117/78, pulse 87, temperature 99 F (37.2 C), temperature source Oral, resp. rate 16, height _0  (1.626 m), weight 82.6 kg, SpO2 98 %.  Body mass index is 31.24 kg/m.  General Appearance: Fairly Groomed  Eye Contact:  Good  Speech:  Normal Rate  Volume:  Normal  Mood:  reports mood is " all right", currently minimizes depression  Affect:  Appropriate  Thought Process:  Linear and Descriptions of Associations: Intact  Orientation:  Other:  fully alert and attentive  Thought Content:  no hallucinations, no delusions, not internally preoccupied   Suicidal Thoughts:  No denies suicidal or self injurious ideations, denies homicidal or violent ideations, specifically also denies any homicidal or violent ideations towards mother   Homicidal Thoughts:  No  Memory:  recent and remote grossly intact   Judgement:  Other:  improving   Insight:  Fair  Psychomotor Activity:  Normal  Concentration:  Concentration: Fair and Attention Span: Fair  Recall:  [employer] of Knowledge:  Fair  Language:  Fair  Akathisia:  Negative  Handed:  Right  AIMS (if indicated):     Assets:  Desire for Improvement Financial Resources/Insurance Housing Physical Health Resilience Social Support  ADL's:  Intact  Cognition:  WNL  Sleep:  Number  of Hours: 4   Assessment - patient reports improving mood , and currently denies suicidal ideations or significant neuro-vegetative symptoms of WDL. Wife, whom I spoke with on phone with his express consent, corroborates he presents with improvement. No current psychomotor agitation and no seizure like activity noted or reported . Thus far tolerating Saphris trial well .   Treatment Plan Summary: Treatment plan reviewed as below today 8/20 Encourage group and milieu participation to work on coping skills and symptom reduction Continue Saphris 5 mgrs SL BID for mood disorder Continue Vistaril 25 mgrs Q 8 hours PRN for anxiety as needed  Check Hgb A1C, Lipid Panel  Treatment team working on disposition planning options  Jenne Campus, MD 08/04/2018, 3:27 PM   Patient ID: Glena Norfolk, male   DOB: Dec 27, 1960, 57 y.o.   MRN: [id number]

## 2018-08-05 ENCOUNTER — Encounter (HOSPITAL_COMMUNITY): Payer: Self-pay | Admitting: Behavioral Health

## 2018-08-05 DIAGNOSIS — F603 Borderline personality disorder: Secondary | ICD-10-CM | POA: Diagnosis not present

## 2018-08-05 DIAGNOSIS — F322 Major depressive disorder, single episode, severe without psychotic features: Secondary | ICD-10-CM | POA: Diagnosis not present

## 2018-08-05 MED ORDER — PANTOPRAZOLE SODIUM 40 MG PO TBEC: 40.0000 mg | DELAYED_RELEASE_TABLET | Freq: Every day | ORAL | 0 refills | Status: DC

## 2018-08-05 MED ORDER — ASENAPINE MALEATE 5 MG SL SUBL: 5.0000 mg | SUBLINGUAL_TABLET | Freq: Two times a day (BID) | SUBLINGUAL | 0 refills | Status: DC

## 2018-08-05 MED ORDER — HYDROXYZINE HCL 25 MG PO TABS: 25.0000 mg | ORAL_TABLET | Freq: Three times a day (TID) | ORAL | 0 refills | Status: DC | PRN

## 2018-08-05 NOTE — BHH Suicide Risk Assessment (Addendum)
[facility]   Principal Problem:  Depression Discharge Diagnoses:  Patient Active Problem List   Diagnosis Date Noted  . MDD (major depressive disorder), severe (HCC) [F32.2] 08/01/2018  . Borderline personality disorder (HCC) [F60.3]   . MDD (major depressive disorder), recurrent severe, without psychosis (HCC) [F33.2] 11/25/2017  . Severe recurrent major depression without psychotic features (HCC) [F33.2] 09/10/2017    Total Time spent with patient: 30 minutes  Musculoskeletal: Strength & Muscle Tone: within normal limits Gait & Station: normal Patient leans: N/A  Psychiatric Specialty Exam: ROS  Blood pressure 120/86, pulse 90, temperature (!) 97.5 F (36.4 C), temperature source Oral, resp. rate 16, height 5\' 4"  (1.626 m), weight 82.6 kg, SpO2 98 %.Body mass index is 31.24 kg/m.  General Appearance: Well Groomed  Patent attorneyye Contact::  Good  Speech:  Normal Rate  Volume:  Normal  Mood:  reports mood is "OK", denies feeling depressed  Affect:  Appropriate, smiles at times appropriately   Thought Process:  Linear and Descriptions of Associations: Intact  Orientation:  Other:  fully alert and attentive   Thought Content:  no hallucinations, no delusions, not internally preoccupied   Suicidal Thoughts:  No denies any suicidal ideations, denies any self injurious ideations . Denies homicidal ideations, specifically also denies any homicidal or violent ideations towards mother  Homicidal Thoughts:  No  Memory:  recent and remote grossly intact   Judgement:  Other:  fair- improving   Insight:  Fair  Psychomotor Activity:  Normal  Concentration:  Good  Recall:  Good  Fund of Knowledge:Good  Language: Good  Akathisia:  Negative  Handed:  Right  AIMS (if indicated):   no abnormal or involuntary movements noted or reported   Assets:  Desire for Improvement Resilience  Sleep:  Number of Hours: 6.75  Cognition: WNL  ADL's:  Intact   Mental Status Per  Nursing Assessment::   On Admission:  Suicidal ideation indicated by patient, Self-harm thoughts, Suicide plan, Intention to act on suicide plan, Belief that plan would result in death  Demographic Factors:  [age], married, unemployed, lives with wife and his mother. Has one adult daughter  Loss Factors: Reports strained relationship with mother  Historical Factors: history of depression, history of prior psychiatric admissions, history of suicidal ideations   Risk Reduction Factors:   Sense of responsibility to family, Living with another person, especially a relative and Positive coping skills or problem solving skills  Continued Clinical Symptoms:  At this time patient is alert, attentive, describes feeling better than on admission, denies depression and describes mood as " cheerful today", affect reactive, no thought disorder, denies SI, denies HI, denies violent or homicidal ideations towards mother, denies hallucinations, no delusions, not internally preoccupied, future oriented . Denies medication side effects- currently on Saphris , which thus far he has tolerated well - side effects discussed including risk of movement disorders, metabolic disorders . Behavior on unit in good control, no disruptive or agitated behaviors  With his express consent I spoke with his wife, who visited him yesterday evening, she corroborates patient presents with improvement and is in agreement with discharge today.  Cognitive Features That Contribute To Risk:  No gross cognitive deficits noted upon discharge. Is alert , attentive, and oriented x 3   Suicide Risk:  Mild:  Suicidal ideation of limited frequency, intensity, duration, and specificity.  There are no identifiable plans, no associated intent, mild dysphoria and related symptoms, good self-control (both objective and subjective assessment),  few other risk factors, and identifiable protective factors, including available and accessible social  support.    Plan Of Care/Follow-up recommendations:  Activity:  as tolerarted  Diet:  regular Tests:  NA Other:  See below  Patient is expressing readiness for discharge and is leaving unit in good spirits  Plans to return home- wife will pick him up later today Plans to follow up for outpatient psychiatric care and with PCP for medical issues as needed .   Craige CottaFernando A Dartanion Teo, MD 08/05/2018, 9:12 AM

## 2018-08-05 NOTE — Discharge Summary (Addendum)
Physician Discharge Summary Note  Patient:  Devin Joseph is an 57 y.o., male MRN:  [id number] DOB:  [birth year] Patient phone:  [phone] (home)  Patient address:   [address] [place] [address],  Total Time spent with patient: 30 minutes  Date of Admission:  08/01/2018 Date of Discharge: 08/05/2018  Reason for Admission:   Patient is seen and examined.  Patient is a 57 year old male with a reported past psychiatric history significant for depression and borderline personality disorder who [facility] department on 8/16 with suicidal ideation.  Also during his evaluation after he had been assessed by tele-psychiatry he became upset.  He was witnessed walking down the hallway, when he suddenly yelled out "oh my legs".  This was witnessed by multiple staff members and there is no seizure activity.  A CODE BLUE was initiated.  He was mildly hypertensive, his CT scan of the head showed minimal intracranial arterial vascular calcifications.  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  The patient was not a great historian and on admission stated that the reason why he became suicidal was because he got into an argument with his mother.  The mother apparently had asked him to assist in neighbor with some kind of work related issue.  This upset him greatly.  He stated that he is always suicidal.  He stated he is always been suicidal.  He stated his mother is live with them for approximately 12 years.  He is also married.  He has been [facility] by a [facility].  He seen them in the last several months.  Apparently he had stopped taking his medicines 2 to 3 days prior to this event.  He stated the medicines do not help, but he is also unsure of what he wants it to help with.  He has previously been on Risperdal, Celexa, Remeron.  He  stated he was unable to remember any other medications he may have been treated with.  His [facility] psychiatric [facility] was [facility] approximately 3 years ago.  He has told the staff that he does not want to have visitors from his family.  I am unsure whether we will be able to get collateral information from them.  He has a reported past medical history significant for gastroesophageal reflux disease and thrombocytopenia.  [facility] was 175,000.  He admitted he is still currently suicidal.  He stated that prior to the argument with his mother he had been stable. Associated Signs/Symptoms:  Principal Problem: <principal problem not specified> Discharge Diagnoses: Patient Active Problem List   Diagnosis Date Noted  . MDD (major depressive disorder), severe (HCC) [F32.2] 08/01/2018  . Borderline personality disorder (HCC) [F60.3]   . MDD (major depressive disorder), recurrent severe, without psychosis (HCC) [F33.2] 11/25/2017  . Severe recurrent major depression without psychotic features [facility](HCC) [F33.2] 09/10/2017    Past Psychiatric History: Patient stated he is been admitted to the psychiatric hospital on 3 or 4 occasions.  His [facility] psychiatric [facility] 3 years ago.  He is followed by a psychiatrist within the [facility].  He saw them within the last 2 months.  He has been previously treated with Celexa, Remeron, Risperdal.  He is unsure of any other medications.  Past Medical History:  Past Medical History:  Diagnosis Date  . Depression   . Suicidal behavior without attempted self-injury   . Thyroid disease     Past Surgical History:  Procedure Laterality Date  . CHOLECYSTECTOMY    . HERNIA REPAIR     Family History: History reviewed. No pertinent family history. Family Psychiatric  History: SEE H&P Social History:  Social History   Substance and Sexual Activity  Alcohol Use No     Social  History   Substance and Sexual Activity  Drug Use No    Social History   Socioeconomic History  . Marital status: Married    Spouse name: Not on file  . Number of children: Not on file  . Years of education: Not on file  . Highest education level: Not on file  Occupational History  . Not on file  Social Needs  . Financial resource strain: Not on file  . Food insecurity:    Worry: Not on file    Inability: Not on file  . Transportation needs:    Medical: Not on file    Non-medical: Not on file  Tobacco Use  . Smoking status: Former Games developermoker  . Smokeless tobacco: Never Used  Substance and Sexual Activity  . Alcohol use: No  . Drug use: No  . Sexual activity: Yes    Birth control/protection: None  Lifestyle  . Physical activity:    Days per week: Not on file    Minutes per session: Not on file  . Stress: Not on file  Relationships  . Social connections:    Talks on phone: Not on file    Gets together: Not on file    Attends religious service: Not on file    Active member of club or organization: Not on file    Attends meetings of clubs or organizations: Not on file    Relationship status: Not on file  Other Topics Concern  . Not on file  Social History Narrative  . Not on file    Hospital Course:  Patient admitted to the unit following SI.   After the above information was gathered and patient evaluated, Devin Joseph was started on medication regimen for presenting symptoms. He was medicated & discharged on;  Saphris 5 mgrs SL BID for mood disorder Continue Vistaril 25 mgrs Q 8 hours PRN for anxiety as needed   He was told to continue other medications as noted below. Patient has been adherent with treatment recommendations. Patient tolerated the medications without any reported side effects are adverse reactions.  Patient was enrolled & participated in the group counseling sessions being offerred & held on this unit. Patient learned coping skills.  Devin Joseph is seen today  by the attending psychiatrist for discharge. Patient denies any delusions, no hallucinations or other psychotic process. Patient denies active or passive suicidal thoughts. No thoughts of violence.  Endorses overall improvement in mood emotional state.    Prior to discharge,  patient is alert, attentive, describes feeling better than on admission, denies depression and describes mood as " cheerful today", affect reactive, no thought disorder, denies SI, denies HI, denies violent or homicidal ideations towards mother, denies hallucinations, no delusions, not internally preoccupied, future oriented . Denies medication side effects- currently on Saphris , which thus far he has tolerated well - side effects discussed including risk of movement disorders, metabolic disorders . Behavior on unit in good control, no disruptive or agitated behaviors  With his express consent I spoke with his wife,  who visited him yesterday evening, she corroborates patient presents with improvement and is in agreement with discharge today.  Nursing staff reports that patient has been appropriate on the unit. Patient has been interacting well with peers. No behavioral issues. Patient has not voiced any suicidal thoughts. Prior to discharge. Patient was discussed at the treatment team meeting this morning. Team members feels that patient is back to his baseline level of functioning. Team agrees with plan to discharge patient today. Patient was provided with all follow-up information to resume mental health treatment following discharge as noted below. Devin Joseph was provided with a prescription for her [facility] discharge medications.  Patient left [facility] with all personal belongings in no apparent distress. Transportation per patient/ family arrangement.     Physical Findings: AIMS: Facial and Oral Movements Muscles of Facial Expression: None, normal Lips and Perioral Area: None, normal Jaw: None, normal Tongue: None, normal,Extremity  Movements Upper (arms, wrists, hands, fingers): None, normal Lower (legs, knees, ankles, toes): None, normal, Trunk Movements Neck, shoulders, hips: None, normal, Overall Severity Severity of abnormal movements (highest score from questions above): None, normal Incapacitation due to abnormal movements: None, normal Patient's awareness of abnormal movements (rate only patient's report): No Awareness, Dental Status Current problems with teeth and/or dentures?: No Does patient usually wear dentures?: No  CIWA:    COWS:     Musculoskeletal: Strength & Muscle Tone: within normal limits Gait & Station: normal Patient leans: N/A  Psychiatric Specialty Exam: SEE SRA BY MD  Physical Exam  Nursing note and vitals reviewed. Constitutional: He is oriented to person, place, and time.  Neurological: He is alert and oriented to person, place, and time.    Review of Systems  Psychiatric/Behavioral: Negative for hallucinations, memory loss, substance abuse and suicidal ideas. Depression: improved. Nervous/anxious: improved. Insomnia: improved.   All other systems reviewed and are negative.   Blood pressure 120/86, pulse 90, temperature (!) 97.5 F (36.4 C), temperature source Oral, resp. rate 16, height 5\' 4"  (1.626 m), weight 82.6 kg, SpO2 98 %.Body mass index is 31.24 kg/m.       Has this patient used any form of tobacco in the last 30 days? (Cigarettes, Smokeless Tobacco, Cigars, and/or Pipes)  N/A  Blood Alcohol level:  Lab Results  Component Value Date   ETH <10 11/26/2017   ETH <10 11/22/2017    Metabolic Disorder Labs:  Lab Results  Component Value Date   HGBA1C 5.7 (H) 11/27/2017   MPG 116.89 11/27/2017   No results found for: PROLACTIN Lab Results  Component Value Date   CHOL 154 11/27/2017   TRIG 206 (H) 11/27/2017   HDL 27 (L) 11/27/2017   CHOLHDL 5.7 11/27/2017   VLDL 41 (H) 11/27/2017   LDLCALC 86 11/27/2017    See Psychiatric Specialty Exam and Suicide Risk  Assessment completed by Attending Physician prior to discharge.  Discharge destination:  Home  Is patient on multiple antipsychotic therapies at discharge:  No   Has Patient had three or more failed trials of antipsychotic monotherapy by history:  No  Recommended Plan for Multiple Antipsychotic Therapies: NA   Allergies as of 08/05/2018      Reactions   Trazodone And Nefazodone Other (See Comments)   Causes muscle jerks/tremors per spouse   Latex Rash   NO POWDERED GLOVES, please!!      Medication List    STOP taking these medications   citalopram 20 MG tablet Commonly known as:  CELEXA   mirtazapine 15 MG  tablet Commonly known as:  REMERON   risperiDONE 1 MG tablet Commonly known as:  RISPERDAL     TAKE these medications     Indication  asenapine 5 MG Subl 24 hr tablet Commonly known as:  SAPHRIS Place 1 tablet (5 mg total) under the tongue 2 (two) times daily.  Indication:  mood stabilization   atorvastatin 40 MG tablet Commonly known as:  LIPITOR Take 1 tablet (40 mg total) by mouth at bedtime. What changed:  how much to take  Indication:  Inherited Homozygous Hypercholesterolemia, Elevation of Both Cholesterol and Triglycerides in Blood   hydrOXYzine 25 MG tablet Commonly known as:  ATARAX/VISTARIL Take 1 tablet (25 mg total) by mouth 3 (three) times daily as needed for anxiety.  Indication:  Feeling Anxious   levothyroxine 112 MCG tablet Commonly known as:  SYNTHROID, LEVOTHROID Take 1 tablet (112 mcg total) by mouth daily before breakfast. For hypothyroidism  Indication:  Underactive Thyroid   pantoprazole 40 MG tablet Commonly known as:  PROTONIX Take 1 tablet (40 mg total) by mouth daily. Start taking on:  08/06/2018  Indication:  Gastroesophageal Reflux Disease, Heartburn      Follow-up Information    [facility]. Go on 08/13/2018.   Why:  Please attend your medication appt with Barbette Merino on Thursday, 08/13/18,  at 9:00am. Contact information: [address]  [place], [place] [address] [phone]          Follow-up recommendations:  Follow up with your outpatient provided for any medical issues. Activity & diet as recommended by your primary care provider.   Comments:  Patient is instructed prior to discharge to: Take all medications as prescribed by his/her mental healthcare provider. Report any adverse effects and or reactions from the medicines to his/her outpatient provider promptly. Patient has been instructed & cautioned: To not engage in alcohol and or illegal drug use while on prescription medicines. In the event of worsening symptoms, patient is instructed to call the crisis hotline, 911 and or go to the nearest ED for appropriate evaluation and treatment of symptoms. To follow-up with his/her primary care provider for your other medical issues, concerns and or health care needs.   Signed: Denzil Magnuson, NP 08/05/2018, 12:50 PM   Patient seen, Suicide Assessment Completed.  Disposition Plan Reviewed

## 2018-08-05 NOTE — B[facility] Suicide Risk Assessment (Signed)
[facility] INPATIENT:  Family/Significant Other Suicide Prevention Education  Suicide Prevention Education:  Patient Refusal for Family/Significant Other Suicide Prevention Education: The patient Devin Joseph has refused to provide written consent for family/significant other to be provided Family/Significant Other Suicide Prevention Education during admission and/or prior to discharge.  Physician notified.  Lorri FrederickWierda, Damica Gravlin Jon, LCSW 08/05/2018, 9:34 AM

## 2018-08-05 NOTE — BHH Group Notes (Signed)
[facility] Mental Health Association Group Therapy 08/05/2018 1:15pm  Type of Therapy: Mental Health Association Presentation  Participation Level: Active  Participation Quality: Attentive  Affect: Appropriate  Cognitive: Oriented  Insight: Developing/Improving  Engagement in Therapy: Engaged  Modes of Intervention: Discussion, Education and Socialization  Summary of Progress/Problems: Mental Health Association (MHA) Speaker came to talk about his personal journey with mental health. The pt processed ways by which to relate to the speaker. MHA speaker provided handouts and educational information pertaining to groups and services offered by the MHA. Pt was engaged in speaker's presentation and was receptive to resources provided.    Vaanya Shambaugh Jon, LCSW 08/05/2018 12:47 PM 

## 2018-08-05 NOTE — Therapy (Signed)
Occupational Therapy Group Note  Date:  08/05/2018 Time:  3:00 PM  Group Topic/Focus:  Self Esteem  Participation Level:  Minimal  Participation Quality:  Inattentive  Affect:  Blunted  Cognitive:  Appropriate  Insight: Limited  Engagement in Group:  Lacking and Limited  Modes of Intervention:  Activity, Discussion, Education and Socialization  Additional Comments:    S: "I just colored it and put my name on it" -pt not willing to participate, showing excess signs of self seeking behavior  O:OT tx with focus on self esteem building this date. Education given on definition of self esteem, with both causes of low and high self esteem identified. Coat of arms activity completed to identify: roles, values, goals, and favorite personal qualities. Pt then to share with peers result of activity. Positive affirmation activity administered, pt to create personal positive affirmations to apply to self to increase self esteem.   A: Pt presents to group with flat affect, minimally engaged and participatory, telling other pts how he did not engage to receive attention. Positive affirmation activity completed, pt identifying a few new positive affirmations during activity to share with group. Pt not fully following activity purposes.  P: Education given on self esteem and how to improve this date. Handouts and activities given to help facilitate skills when reintegrating into community.   [name], MSOT, OTR/L  [place] Eula Mazzola 08/05/2018, 3:00 PM

## 2018-08-05 NOTE — Progress Notes (Signed)
  [facility] Discharge Plan :  Will you be returning to the same living situation after discharge:  Yes,  with wife At discharge, do you have transportation home?: Yes,  wife Do you have the ability to pay for your medications: Yes,  [employer] medicare  Release of information consent forms completed and in the chart;  Patient's signature needed at discharge.  Patient to Follow up at: Follow-up Information    [facility]. Go on 08/13/2018.   Why:  Please attend your medication appt with Barbette Merinoarolyn McDonald on Thursday, 08/13/18, at 9:00am. Contact information: [address],  [place], [place] [address] [phone]          Next level of care provider has access to [facility] Link:no  Safety Planning and Suicide Prevention discussed: No.Pt declined consent.     Has patient been referred to the Quitline?: N/A patient is not a smoker  Patient has been referred for addiction treatment: N/A  Lorri FrederickWierda, Roshad Hack Jon, LCSW 08/05/2018, 11:56 AM

## 2018-08-05 NOTE — Progress Notes (Signed)
Patient ID: Devin Joseph, male   DOB: 05/30/1961, 57 y.o.   MRN: [id number]  Pt educated on all of his discharge instructions and verbalized understanding.  Pt denies SI/HI/AVH, verbally contracted for safety outside of the hospital and transportation home was provided by his wife.

## 2018-08-05 NOTE — Progress Notes (Signed)
Recreation Therapy Notes  Date: 9.21.19 Time: 0930 Location: [address] Dayroom  Group Topic: Stress Management  Goal Area(s) Addresses:  Patient will verbalize importance of using healthy stress management.  Patient will identify positive emotions associated with healthy stress management.   Intervention: Stress Management  Activity : Guided Imagery.  LRT introduced the stress management technique of guided imagery.  LRT read a script that allowed patients to envision being on the beach.  Patients were to follow along as LRT read script.  Education:  Stress Management, Discharge Planning.   Education Outcome: Acknowledges edcuation/In group clarification offered/Needs additional education  Clinical Observations/Feedback: Pt did not attend group.    Caroll RancherMarjette Donielle Radziewicz, LRT/CTRS         Lillia AbedLindsay, Boleslaw Borghi A 08/05/2018 11:02 AM

## 2018-11-29 ENCOUNTER — Encounter (HOSPITAL_COMMUNITY): Payer: Self-pay

## 2018-11-29 ENCOUNTER — Emergency Department (HOSPITAL_COMMUNITY)
Admission: EM | Admit: 2018-11-29 | Discharge: 2018-11-30 | Payer: Medicare HMO | Attending: Emergency Medicine | Admitting: Emergency Medicine

## 2018-11-29 ENCOUNTER — Other Ambulatory Visit: Payer: Self-pay

## 2018-11-29 DIAGNOSIS — Z87891 Personal history of nicotine dependence: Secondary | ICD-10-CM | POA: Insufficient documentation

## 2018-11-29 DIAGNOSIS — Z79899 Other long term (current) drug therapy: Secondary | ICD-10-CM | POA: Diagnosis not present

## 2018-11-29 DIAGNOSIS — R45851 Suicidal ideations: Secondary | ICD-10-CM | POA: Diagnosis not present

## 2018-11-29 DIAGNOSIS — F329 Major depressive disorder, single episode, unspecified: Secondary | ICD-10-CM | POA: Diagnosis not present

## 2018-11-29 DIAGNOSIS — F32A Depression, unspecified: Secondary | ICD-10-CM

## 2018-11-29 DIAGNOSIS — F332 Major depressive disorder, recurrent severe without psychotic features: Secondary | ICD-10-CM | POA: Diagnosis present

## 2018-11-29 DIAGNOSIS — F321 Major depressive disorder, single episode, moderate: Secondary | ICD-10-CM

## 2018-11-29 LAB — RAPID URINE DRUG SCREEN, HOSP PERFORMED
Amphetamines: NOT DETECTED
BARBITURATES: NOT DETECTED
BENZODIAZEPINES: NOT DETECTED
Cocaine: NOT DETECTED
Opiates: NOT DETECTED
TETRAHYDROCANNABINOL: NOT DETECTED

## 2018-11-29 LAB — COMPREHENSIVE METABOLIC PANEL
ALBUMIN: 4.1 g/dL (ref 3.5–5.0)
ALK PHOS: 107 U/L (ref 38–126)
ALT: 27 U/L (ref 0–44)
AST: 24 U/L (ref 15–41)
Anion gap: 12 (ref 5–15)
BILIRUBIN TOTAL: 1.1 mg/dL (ref 0.3–1.2)
BUN: 13 mg/dL (ref 6–20)
CO2: 27 mmol/L (ref 22–32)
Calcium: 8.9 mg/dL (ref 8.9–10.3)
Chloride: 101 mmol/L (ref 98–111)
Creatinine, Ser: 1.43 mg/dL — ABNORMAL HIGH (ref 0.61–1.24)
GFR calc Af Amer: >60 mL/min (ref >60)
GFR calc non Af Amer: 55 mL/min — ABNORMAL LOW (ref >60)
GLUCOSE: 97 mg/dL (ref 70–99)
POTASSIUM: 3.9 mmol/L (ref 3.5–5.1)
SODIUM: 140 mmol/L (ref 135–145)
TOTAL PROTEIN: 7.5 g/dL (ref 6.5–8.1)

## 2018-11-29 LAB — CBC
HCT: 47.1 % (ref 39.0–52.0)
HEMOGLOBIN: 14.9 g/dL (ref 13.0–17.0)
MCH: 28.7 pg (ref 26.0–34.0)
MCHC: 31.6 g/dL (ref 30.0–36.0)
MCV: 90.8 fL (ref 80.0–100.0)
NRBC: 0 % (ref 0.0–0.2)
Platelets: 159 10*3/uL (ref 150–400)
RBC: 5.19 MIL/uL (ref 4.22–5.81)
RDW: 13.5 % (ref 11.5–15.5)
WBC: 7.6 10*3/uL (ref 4.0–10.5)

## 2018-11-29 LAB — ETHANOL: Alcohol, Ethyl (B): <10 mg/dL (ref <10)

## 2018-11-29 MED ORDER — RISPERIDONE 0.5 MG PO TABS: 0.5000 mg | ORAL_TABLET | Freq: Two times a day (BID) | ORAL | Status: DC

## 2018-11-29 MED ORDER — HYDROXYZINE HCL 25 MG PO TABS: 25.0000 mg | ORAL_TABLET | Freq: Three times a day (TID) | ORAL | Status: DC | PRN

## 2018-11-29 MED ORDER — LEVOTHYROXINE SODIUM 112 MCG PO TABS
112.0000 ug | ORAL_TABLET | Freq: Every day | ORAL | Status: DC
  Filled 2018-11-29: qty 1

## 2018-11-29 NOTE — ED Provider Notes (Signed)
[facility] EMERGENCY DEPARTMENT Provider Note   CSN: [id number] Arrival date & time: 11/29/18  1823     History   Chief Complaint Chief Complaint  Patient presents with  . Suicidal  . Medical Clearance    HPI Devin Joseph is a 57 y.o. male.  Patient with hx depression, c/o increased feelings of depression in past couple weeks. Symptoms gradual onset, moderate, slowly worse, without specific inciting/exacerbating factor. Denies specific inciting event. States today quit taking his depression meds as he is depressed, and feels they dont help. Also notes suicidal thoughts, but denies specific plan. Denies any attempt to harm self. Occasional etoh use, denies daily use. Denies other drug use. Decreased appetite. No wt loss. Is able to sleep at night. Denies acute physical illness or symptoms.   The history is provided by the patient and a relative.    Past Medical History:  Diagnosis Date  . Depression   . Suicidal behavior without attempted self-injury   . Thyroid disease     Patient Active Problem List   Diagnosis Date Noted  . MDD (major depressive disorder), severe (HCC) 08/01/2018  . Borderline personality disorder (HCC)   . MDD (major depressive disorder), recurrent severe, without psychosis (HCC) 11/25/2017  . Severe recurrent major depression without psychotic features (HCC) 09/10/2017    Past Surgical History:  Procedure Laterality Date  . CHOLECYSTECTOMY    . HERNIA REPAIR          Home Medications    Prior to Admission medications   Medication Sig Start Date End Date Taking? Authorizing Provider  acetaminophen (TYLENOL) 500 MG tablet Take 1,000-1,500 mg by mouth every 6 (six) hours as needed for headache (pain).   Yes [provider]  atorvastatin (LIPITOR) 40 MG tablet Take 1 tablet (40 mg total) by mouth at bedtime. Patient taking differently: Take 20 mg by mouth at bedtime.  11/30/17  Yes Oneta Rack, NP  carbamide  peroxide (DEBROX) 6.5 % OTIC solution Place 2 drops into both ears every 30 (thirty) days.   Yes [provider]  famotidine (PEPCID) 20 MG tablet Take 20 mg by mouth daily.   Yes [provider]  levothyroxine (SYNTHROID, LEVOTHROID) 112 MCG tablet Take 1 tablet (112 mcg total) by mouth daily before breakfast. For hypothyroidism 09/15/17  Yes Nwoko, Agnes I, NP  risperiDONE (RISPERDAL) 0.5 MG tablet Take 0.5 mg by mouth 2 (two) times daily.   Yes [provider]  asenapine (SAPHRIS) 5 MG SUBL 24 hr tablet Place 1 tablet (5 mg total) under the tongue 2 (two) times daily. Patient not taking: Reported on 11/29/2018 08/05/18   Denzil Magnuson, NP  hydrOXYzine (ATARAX/VISTARIL) 25 MG tablet Take 1 tablet (25 mg total) by mouth 3 (three) times daily as needed for anxiety. Patient not taking: Reported on 11/29/2018 08/05/18   Denzil Magnuson, NP  pantoprazole (PROTONIX) 40 MG tablet Take 1 tablet (40 mg total) by mouth daily. Patient not taking: Reported on 11/29/2018 08/06/18   Denzil Magnuson, NP    Family History No family history on file.  Social History Social History   Tobacco Use  . Smoking status: Former Games developer  . Smokeless tobacco: Never Used  Substance Use Topics  . Alcohol use: No  . Drug use: No     Allergies   Trazodone and nefazodone and Latex   Review of Systems Review of Systems  Constitutional: Negative for fever.  HENT: Negative for sore throat.   Eyes:  Negative for redness.  Respiratory: Negative for cough and shortness of breath.   Cardiovascular: Negative for chest pain.  Gastrointestinal: Negative for abdominal pain and vomiting.  Genitourinary: Negative for flank pain.  Musculoskeletal: Negative for back pain and neck pain.  Skin: Negative for rash.  Neurological: Negative for headaches.  Hematological: Does not bruise/bleed easily.  Psychiatric/Behavioral: Positive for dysphoric mood.     Physical Exam Updated Vital  Signs BP 124/89   Pulse 77   Resp 14   SpO2 97%   Physical Exam Vitals signs and nursing note reviewed.  Constitutional:      Appearance: He is well-developed.  HENT:     Head: Atraumatic.     Mouth/Throat:     Mouth: Mucous membranes are moist.     Pharynx: Oropharynx is clear.  Eyes:     Conjunctiva/sclera: Conjunctivae normal.  Neck:     Musculoskeletal: Neck supple.     Trachea: No tracheal deviation.  Cardiovascular:     Rate and Rhythm: Normal rate.     Pulses: Normal pulses.     Heart sounds: Normal heart sounds.  Pulmonary:     Effort: Pulmonary effort is normal. No accessory muscle usage or respiratory distress.     Breath sounds: Normal breath sounds.  Abdominal:     General: Bowel sounds are normal. There is no distension.     Palpations: Abdomen is soft.     Tenderness: There is no abdominal tenderness.  Genitourinary:    Comments: No cva tenderness.  Musculoskeletal:        General: No swelling.  Skin:    General: Skin is warm and dry.     Findings: No rash.  Neurological:     Mental Status: He is alert.     Comments: Speech clear/fluent. Ambulates w steady gait. No tremor or shakes.   Psychiatric:     Comments: Depressed mood. +SI.       ED Treatments / Results  Labs (all labs ordered are listed, but only abnormal results are displayed) Results for orders placed or performed during the hospital encounter of 11/22/17  Comprehensive metabolic panel  Result Value Ref Range   Sodium 136 135 - 145 mmol/L   Potassium 3.7 3.5 - 5.1 mmol/L   Chloride 103 101 - 111 mmol/L   CO2 22 22 - 32 mmol/L   Glucose, Bld 96 65 - 99 mg/dL   BUN 9 6 - 20 mg/dL   Creatinine, Ser 1.61 (H) 0.61 - 1.24 mg/dL   Calcium 8.8 (L) 8.9 - 10.3 mg/dL   Total Protein 7.1 6.5 - 8.1 g/dL   Albumin 4.2 3.5 - 5.0 g/dL   AST 25 15 - 41 U/L   ALT 24 17 - 63 U/L   Alkaline Phosphatase 116 38 - 126 U/L   Total Bilirubin 0.9 0.3 - 1.2 mg/dL   GFR calc non Af Amer 51 (L) >60  mL/min   GFR calc Af Amer 59 (L) >60 mL/min   Anion gap 11 5 - 15  Ethanol  Result Value Ref Range   Alcohol, Ethyl (B) <10 <10 mg/dL  Salicylate level  Result Value Ref Range   Salicylate Lvl <7.0 2.8 - 30.0 mg/dL  Acetaminophen level  Result Value Ref Range   Acetaminophen (Tylenol), Serum <10 (L) 10 - 30 ug/mL  cbc  Result Value Ref Range   WBC 8.7 4.0 - 10.5 K/uL   RBC 5.19 4.22 - 5.81 MIL/uL   Hemoglobin 15.4 13.0 -  17.0 g/dL   HCT 16.145.5 09.639.0 - 04.552.0 %   MCV 87.7 78.0 - 100.0 fL   MCH 29.7 26.0 - 34.0 pg   MCHC 33.8 30.0 - 36.0 g/dL   RDW 40.913.6 81.111.5 - 91.415.5 %   Platelets 167 150 - 400 K/uL  Rapid urine drug screen (hospital performed)  Result Value Ref Range   Opiates NONE DETECTED NONE DETECTED   Cocaine NONE DETECTED NONE DETECTED   Benzodiazepines NONE DETECTED NONE DETECTED   Amphetamines NONE DETECTED NONE DETECTED   Tetrahydrocannabinol NONE DETECTED NONE DETECTED   Barbiturates NONE DETECTED NONE DETECTED    EKG None  Radiology No results found.  Procedures Procedures (including critical care time)  Medications Ordered in ED Medications - No data to display   Initial Impression / Assessment and Plan / ED Course  I have reviewed the triage vital signs and the nursing notes.  Pertinent labs & imaging results that were available during my care of the patient were reviewed by me and considered in my medical decision making (see chart for details).  Labs sent.   BH team consulted.   Reviewed nursing notes and prior charts for additional history.   Labs reviewed - chem normal.   BH evaluation pending  [facility]     Final Clinical Impressions(s) / ED Diagnoses   Final diagnoses:  None    ED Discharge Orders    None       Cathren LaineSteinl, Kenley Troop, MD 11/29/18 1920

## 2018-11-29 NOTE — ED Notes (Signed)
Patient wanded by security. 

## 2018-11-29 NOTE — BH Assessment (Signed)
Tele Assessment Note   Patient Name: Devin Joseph MRN: [id number] Referring Physician: Dr. Cathren LaineKevin Steinl Location of Patient: [facility]  Location of Provider: Behavioral Health TTS Department  Devin Joseph is an 57 y.o. male presenting with SI with plan to walk into traffic. Patient reported not taking medication today. Patient reported trigger was receiving a box in the mail today and being told it was for Christmas and that he could open it, then being told "no" that he would have to wait until Christmas Day to open the present which was for he and his wife. Patient was told no by his mother and wife, whom he resides. Patient reported becoming upset and throwing things in his office in the trash until his wife stopped him. Patient reported SI with attempt 1 year ago by putting knife to throat and belly. Patient reported his fear is being alone and afraid. Patient reported catatonic spells during sleeping overnight described as "jaw locks up , staring into space about 5 hours and will wake up out of it when I have to go use the bathroom". Patient reports normal appetite. Patient reported diagnosis of "learning comprehensive and communicating with others". Clinician reviewed medical regarding other diagnosis. Patient denies SI, HI and psychosis. Patient was cooperative during assessment.   Diagnosis: Major Depressive Disorder  Past Medical History:  Past Medical History:  Diagnosis Date  . Depression   . Suicidal behavior without attempted self-injury   . Thyroid disease     Past Surgical History:  Procedure Laterality Date  . CHOLECYSTECTOMY    . HERNIA REPAIR      Family History: No family history on file.  Social History:  reports that he has quit smoking. He has never used smokeless tobacco. He reports that he does not drink alcohol or use drugs.  Additional Social History:  Alcohol / Drug Use Pain Medications: see MAR Prescriptions: see MAR Over the Counter: see MAR  CIWA:  CIWA-Ar BP: 124/89 Pulse Rate: 77 COWS:    Allergies:  Allergies  Allergen Reactions  . Trazodone And Nefazodone Other (See Comments)    Causes muscle jerks/tremors per spouse  . Latex Rash    NO POWDERED GLOVES, please!!    Home Medications: (Not in a hospital admission)   OB/GYN Status:  No LMP for male patient.  General Assessment Data Location of Assessment: [facility] TTS Assessment: In system Is this a Tele or Face-to-Face Assessment?: Tele Assessment Is this an Initial Assessment or a Re-assessment for this encounter?: Initial Assessment Patient Accompanied by:: N/A Language Other than English: No Living Arrangements: (family home) What gender do you identify as?: Male Marital status: Married Living Arrangements: Spouse/significant other Can pt return to current living arrangement?: Yes Admission Status: Voluntary Is patient capable of signing voluntary admission?: Yes Referral Source: Self/Family/Friend     Crisis Care Plan Living Arrangements: Spouse/significant other Legal Guardian: (self) Name of Psychiatrist: (none) Name of Therapist: (none)  Education Status Is patient currently in school?: No Is the patient employed, unemployed or receiving disability?: Receiving disability income  Risk to self with the past 6 months Suicidal Ideation: Yes-Currently Present Has patient been a risk to self within the past 6 months prior to admission? : Yes Suicidal Intent: Yes-Currently Present Has patient had any suicidal intent within the past 6 months prior to admission? : No Is patient at risk for suicide?: Yes Suicidal Plan?: Yes-Currently Present Has patient had any suicidal plan within the past 6 months prior to admission? :  No Specify Current Suicidal Plan: (walk into traffic) Access to Means: Yes Specify Access to Suicidal Means: (walk into traffic) What has been your use of drugs/alcohol within the last 12 months?: (none) Previous Attempts/Gestures:  Yes How many times?: (1) Triggers for Past Attempts: Unpredictable Intentional Self Injurious Behavior: None Family Suicide History: No Recent stressful life event(s): Other (Comment)(unable to open Christmas package) Persecutory voices/beliefs?: No Depression: No Depression Symptoms: (denied) Substance abuse history and/or treatment for substance abuse?: No  Risk to Others within the past 6 months Homicidal Ideation: No Does patient have any lifetime risk of violence toward others beyond the six months prior to admission? : No Thoughts of Harm to Others: No Current Homicidal Intent: No Current Homicidal Plan: No Access to Homicidal Means: No History of harm to others?: No Assessment of Violence: None Noted Does patient have access to weapons?: No Criminal Charges Pending?: No Does patient have a court date: No Is patient on probation?: No  Psychosis Hallucinations: None noted Delusions: None noted  Mental Status Report Appearance/Hygiene: Unremarkable Eye Contact: Fair Motor Activity: Freedom of movement Speech: Logical/coherent Level of Consciousness: Alert Mood: Pleasant Affect: Appropriate to circumstance Anxiety Level: Minimal Thought Processes: Coherent, Relevant Judgement: Partial Orientation: Person, Place, Time, Situation Obsessive Compulsive Thoughts/Behaviors: None  Cognitive Functioning Concentration: Normal Memory: Recent Intact, Remote Impaired Is patient IDD: No Insight: Fair Impulse Control: Poor Appetite: Good Have you had any weight changes? : No Change Sleep: No Change Total Hours of Sleep: (8) Vegetative Symptoms: None  ADLScreening [facility] Assessment Services) Patient's cognitive ability adequate to safely complete daily activities?: Yes Patient able to express need for assistance with ADLs?: Yes Independently performs ADLs?: Yes (appropriate for developmental age)  Prior Inpatient Therapy Prior Inpatient Therapy: Yes Prior Therapy  Dates: (08/2017 and 11/2017 ????) Prior Therapy Facilty/Provider(s): [facility]) Reason for Treatment: (SI)  Prior Outpatient Therapy Prior Outpatient Therapy: No Does patient have an ACCT team?: No Does patient have Intensive In-House Services?  : No Does patient have [facility] services? : No Does patient have P4CC services?: No  ADL Screening (condition at time of admission) Patient's cognitive ability adequate to safely complete daily activities?: Yes Patient able to express need for assistance with ADLs?: Yes Independently performs ADLs?: Yes (appropriate for developmental age)   Merchant navy officer (For Healthcare) Does Patient Have a Medical Advance Directive?: No Would patient like information on creating a medical advance directive?: No - Patient declined   Disposition:  Disposition Initial Assessment Completed for this Encounter: Yes  Nira Conn, NP, recommends overnight observation for stabilization and safety with re evaluation psych consult in the morning. Katie, RN, notified of disposition.   This service was provided via telemedicine using a 2-way, interactive audio and video technology.  Names of all persons participating in this telemedicine service and their role in this encounter. Name: Wilson Singer Role: patient  Name: Al Corpus, [facility] Role: TTS Clinician  Name:  Role:   Name:  Role:     Burnetta Sabin, [facility] 11/29/2018 9:13 PM

## 2018-11-29 NOTE — ED Notes (Signed)
Patient denies thoughts of SI or HI at this time. States he stopped taking his meds because he got mad "his mother wouldn't let him open a package she got in the mail the other day and his wife thought that he needed to come here again".

## 2018-11-29 NOTE — ED Notes (Signed)
Unable to reach nurse after multiple attempts, was told by secretary due to traumas nurses were not available. TTS Clinician advised secretary to have nurse call when patient is in a room and able to be see.

## 2018-11-29 NOTE — ED Triage Notes (Signed)
Pt states he stopped taking all of his medication today because he "just doesn't care anymore". When asked to elaborate pt refuses to answer questions. Pt spouse at bedside to answer screening questions. Pt states he has a plan to walk out in to traffic and let a car hit him. Pt states he has never acted out on his plan.

## 2018-11-29 NOTE — ED Notes (Signed)
ALL Belongings inventoried - 3 labeled belongings bags - [place] - NO Valuables noted.

## 2018-11-29 NOTE — ED Notes (Signed)
Pt getting dressed in burgundy scrubs

## 2018-11-29 NOTE — ED Notes (Signed)
TTS in process 

## 2018-11-29 NOTE — ED Notes (Signed)
Nira ConnJason Berry, NP, recommends overnight observation for stabilization and safety with re evaluation psych consult in the morning. Katie, RN, notified of disposition.

## 2018-11-29 NOTE — ED Notes (Signed)
Pt wife is taking pt wallet home

## 2018-11-30 DIAGNOSIS — F321 Major depressive disorder, single episode, moderate: Secondary | ICD-10-CM | POA: Diagnosis not present

## 2018-11-30 DIAGNOSIS — F32A Depression, unspecified: Secondary | ICD-10-CM | POA: Insufficient documentation

## 2018-11-30 NOTE — ED Provider Notes (Signed)
57 year old male here with suicidal ideations.  No changes in medical care, no changes overnight.  Labs and vitals reviewed.  Awaiting behavioral health disposition.  Please see previous providers note for full H&P.  Today's Vitals   11/29/18 1842 11/29/18 1843 11/29/18 1949 11/30/18 0700  BP: 124/89   108/76  Pulse: 77   79  Resp: 14   14  Temp:    98 F (36.7 C)  TempSrc:    Oral  SpO2: 97%   93%  PainSc:  0-No pain 0-No pain    There is no height or weight on file to calculate BMI.   Eyvonne MechanicHedges, Gisel Vipond, PA-C 11/30/18 [id number]    Rolan BuccoBelfi, Melanie, MD 11/30/18 [phone]

## 2018-11-30 NOTE — Progress Notes (Signed)
[facility] Dr. Ashley AkinGisella Kohl is the accepting/attending provider.  Call report to [phone]  [facility].   Pt is Voluntary.  Pt may be transported by [facility] Pt scheduled  [facility] as [facility] as transport can be arranged.  Timmothy EulerJean T. Kaylyn LimSutter, MSW, LCSWA Disposition Clinical Social Work [phone] (cell) [phone] (office)

## 2018-11-30 NOTE — Consult Note (Signed)
                                Tele-Psych Consultation   Patient is seen and chart is reviewed. He continues to endorse active suicidal ideation to "run into traffic. I just don't care. I have nothing to live for. My mother gets on my nerves and my wife is not wanting to have me anymore. I stopped my Risperdal a week ago because I just got tired of taking it. I see and hear things off and on. It's hard for me to describe it but it bothers me. I'm pretty sure if I go home that I will end up harming myself." Fayrene FearingJames is unable to contract for his safety outside the hospital. Recommend psychiatric inpatient treatment due to active suicidal ideation with plan and also the presence of psychotic symptoms.  Fransisca KaufmannLaura Willodean Leven PMHNP-C 11/30/2018 09:50 am

## 2018-11-30 NOTE — ED Notes (Signed)
Regular Diet was ordered for Lunch. 

## 2018-11-30 NOTE — ED Notes (Signed)
Breakfast tray ordered 

## 2019-12-14 ENCOUNTER — Other Ambulatory Visit: Payer: Self-pay | Admitting: Otolaryngology

## 2019-12-21 ENCOUNTER — Other Ambulatory Visit: Payer: Self-pay

## 2019-12-21 ENCOUNTER — Encounter (HOSPITAL_BASED_OUTPATIENT_CLINIC_OR_DEPARTMENT_OTHER): Payer: Self-pay | Admitting: Otolaryngology

## 2019-12-21 NOTE — Progress Notes (Signed)
Completed pre op call with pt and verified all meds with his wife as well as covid testing date and location, and quarantine.

## 2019-12-23 ENCOUNTER — Other Ambulatory Visit (HOSPITAL_COMMUNITY)
Admission: RE | Admit: 2019-12-23 | Discharge: 2019-12-23 | Disposition: A | Discharge status: Home / Self Care | Payer: Medicare HMO | Source: Ambulatory Visit | Attending: Otolaryngology | Admitting: Otolaryngology

## 2019-12-23 DIAGNOSIS — Z20822 Contact with and (suspected) exposure to covid-19: Secondary | ICD-10-CM | POA: Insufficient documentation

## 2019-12-23 DIAGNOSIS — Z01812 Encounter for preprocedural laboratory examination: Secondary | ICD-10-CM | POA: Diagnosis present

## 2019-12-25 LAB — NOVEL CORONAVIRUS, NAA (HOSP ORDER, SEND-OUT TO REF LAB; TAT 18-24 HRS): SARS-CoV-2, NAA: NOT DETECTED

## 2019-12-27 ENCOUNTER — Ambulatory Visit (HOSPITAL_BASED_OUTPATIENT_CLINIC_OR_DEPARTMENT_OTHER): Payer: Medicare HMO | Admitting: Anesthesiology

## 2019-12-27 ENCOUNTER — Ambulatory Visit (HOSPITAL_BASED_OUTPATIENT_CLINIC_OR_DEPARTMENT_OTHER)
Admission: RE | Admit: 2019-12-27 | Discharge: 2019-12-27 | Disposition: A | Discharge status: Home / Self Care | Payer: Medicare HMO | Attending: Otolaryngology | Admitting: Otolaryngology

## 2019-12-27 ENCOUNTER — Encounter (HOSPITAL_BASED_OUTPATIENT_CLINIC_OR_DEPARTMENT_OTHER)
Admission: RE | Disposition: A | Discharge status: Home / Self Care | Payer: Self-pay | Source: Home / Self Care | Attending: Otolaryngology

## 2019-12-27 ENCOUNTER — Encounter (HOSPITAL_BASED_OUTPATIENT_CLINIC_OR_DEPARTMENT_OTHER): Payer: Self-pay | Admitting: Otolaryngology

## 2019-12-27 ENCOUNTER — Other Ambulatory Visit: Payer: Self-pay

## 2019-12-27 DIAGNOSIS — E079 Disorder of thyroid, unspecified: Secondary | ICD-10-CM | POA: Insufficient documentation

## 2019-12-27 DIAGNOSIS — Z79899 Other long term (current) drug therapy: Secondary | ICD-10-CM | POA: Insufficient documentation

## 2019-12-27 DIAGNOSIS — K219 Gastro-esophageal reflux disease without esophagitis: Secondary | ICD-10-CM | POA: Diagnosis not present

## 2019-12-27 DIAGNOSIS — Z79890 Hormone replacement therapy (postmenopausal): Secondary | ICD-10-CM | POA: Insufficient documentation

## 2019-12-27 DIAGNOSIS — J449 Chronic obstructive pulmonary disease, unspecified: Secondary | ICD-10-CM | POA: Diagnosis not present

## 2019-12-27 DIAGNOSIS — Z87891 Personal history of nicotine dependence: Secondary | ICD-10-CM | POA: Diagnosis not present

## 2019-12-27 DIAGNOSIS — G4733 Obstructive sleep apnea (adult) (pediatric): Secondary | ICD-10-CM | POA: Diagnosis present

## 2019-12-27 HISTORY — PX: DRUG INDUCED ENDOSCOPY: SHX6808

## 2019-12-27 HISTORY — DX: Chronic obstructive pulmonary disease, unspecified: J44.9

## 2019-12-27 HISTORY — DX: Gastro-esophageal reflux disease without esophagitis: K21.9

## 2019-12-27 SURGERY — DRUG INDUCED SLEEP ENDOSCOPY
Anesthesia: Monitor Anesthesia Care | Site: Nose

## 2019-12-27 MED ORDER — PROPOFOL 500 MG/50ML IV EMUL
INTRAVENOUS | Status: DC | PRN
  Administered 2019-12-27: 10 mg via INTRAVENOUS
  Administered 2019-12-27: 20 mg via INTRAVENOUS
  Administered 2019-12-27: 35 ug/kg/min via INTRAVENOUS
  Administered 2019-12-27: 10 mg via INTRAVENOUS

## 2019-12-27 MED ORDER — ONDANSETRON HCL 4 MG/2ML IJ SOLN
INTRAMUSCULAR | Status: DC | PRN
  Administered 2019-12-27: 4 mg via INTRAVENOUS

## 2019-12-27 MED ORDER — LIDOCAINE HCL (CARDIAC) PF 100 MG/5ML IV SOSY
PREFILLED_SYRINGE | INTRAVENOUS | Status: DC | PRN
  Administered 2019-12-27: 50 mg via INTRAVENOUS

## 2019-12-27 MED ORDER — ONDANSETRON HCL 4 MG/2ML IJ SOLN: 4.0000 mg | Freq: Once | INTRAMUSCULAR | Status: DC | PRN

## 2019-12-27 MED ORDER — FENTANYL CITRATE (PF) 100 MCG/2ML IJ SOLN: 25.0000 ug | INTRAMUSCULAR | Status: DC | PRN

## 2019-12-27 MED ORDER — ACETAMINOPHEN 10 MG/ML IV SOLN: 1000.0000 mg | Freq: Once | INTRAVENOUS | Status: DC | PRN

## 2019-12-27 MED ORDER — LACTATED RINGERS IV SOLN: INTRAVENOUS | Status: DC

## 2019-12-27 SURGICAL SUPPLY — 17 items
CANISTER SUCT 1200ML W/VALVE (MISCELLANEOUS) ×2 IMPLANT
COVER WAND RF STERILE (DRAPES) IMPLANT
DRAPE HALF SHEET 70X43 (DRAPES) IMPLANT
GLOVE BIO SURGEON STRL SZ7.5 (GLOVE) IMPLANT
GLOVE BIOGEL PI IND STRL 7.0 (GLOVE) ×1 IMPLANT
GLOVE BIOGEL PI INDICATOR 7.0 (GLOVE) ×1
GLOVE SURG SYN 7.5  E (GLOVE) ×1
GLOVE SURG SYN 7.5 E (GLOVE) ×1 IMPLANT
KIT CLEAN ENDO (MISCELLANEOUS) ×2 IMPLANT
NEEDLE PRECISIONGLIDE 27X1.5 (NEEDLE) IMPLANT
PACK BASIN DAY SURGERY FS (CUSTOM PROCEDURE TRAY) IMPLANT
PATTIES SURGICAL .5 X3 (DISPOSABLE) IMPLANT
SOL ANTI FOG 6CC (MISCELLANEOUS) ×1 IMPLANT
SOLUTION ANTI FOG 6CC (MISCELLANEOUS) ×1
SYR CONTROL 10ML LL (SYRINGE) IMPLANT
TOWEL GREEN STERILE FF (TOWEL DISPOSABLE) ×2 IMPLANT
TUBE CONNECTING 20X1/4 (TUBING) IMPLANT

## 2019-12-27 NOTE — Anesthesia Preprocedure Evaluation (Addendum)
Anesthesia Evaluation  Patient identified by MRN, date of birth, ID band Patient awake    Reviewed: Allergy & Precautions, NPO status , Patient's Chart, lab work & pertinent test results  Airway Mallampati: IV  TM Distance: <3 FB Neck ROM: Full    Dental no notable dental hx. (+) Teeth Intact   Pulmonary COPD,  COPD inhaler, former smoker,    Pulmonary exam normal breath sounds clear to auscultation       Cardiovascular Exercise Tolerance: Good negative cardio ROS Normal cardiovascular exam Rhythm:Regular Rate:Normal     Neuro/Psych Depression negative neurological ROS     GI/Hepatic Neg liver ROS, GERD  ,  Endo/Other  negative endocrine ROS  Renal/GU negative Renal ROS     Musculoskeletal negative musculoskeletal ROS (+)   Abdominal (+) + obese,   Peds  Hematology negative hematology ROS (+)   Anesthesia Other Findings   Reproductive/Obstetrics                             Anesthesia Physical Anesthesia Plan  ASA: III  Anesthesia Plan: MAC   Post-op Pain Management:    Induction: Intravenous  PONV Risk Score and Plan: Treatment may vary due to age or medical condition  Airway Management Planned: Natural Airway and Nasal Cannula  Additional Equipment: None  Intra-op Plan:   Post-operative Plan:   Informed Consent: I have reviewed the patients History and Physical, chart, labs and discussed the procedure including the risks, benefits and alternatives for the proposed anesthesia with the patient or authorized representative who has indicated his/her understanding and acceptance.     Dental advisory given  Plan Discussed with:   Anesthesia Plan Comments:        Anesthesia Quick Evaluation

## 2019-12-27 NOTE — Op Note (Signed)
NAME: Devin Joseph, Devin Joseph MEDICAL RECORD OF:[id number] ACCOUNT [id number] DATE OF BIRTH:1961-06-29 FACILITY: [facility] LOCATION: [facility] PHYSICIAN:Bohdi Leeds Pearletha Alfred, MD  OPERATIVE REPORT  DATE OF PROCEDURE:  12/27/2019  PREOPERATIVE DIAGNOSIS:  Obstructive sleep apnea.  POSTOPERATIVE DIAGNOSIS:  Obstructive sleep apnea.  PROCEDURE:  Drug-induced sleep endoscopy.  SURGEON:  Christia Reading, MD  ANESTHESIA:  IV sedation.  COMPLICATIONS:  None.  INDICATIONS:  The patient is a 59 year old male with a diagnosis of obstructive sleep apnea who has been unable to tolerate CPAP.  He presents to the operating room for sleep endoscopy.  FINDINGS:  Upon sleep endoscopy, his pharyngeal airway closes at the velum 50% anteroposterior and 50% from the sidewalls, making him a good candidate for the [facility] device.  His closure at that level was not considered to be concentric.  The epiglottis  was in a very posterior position with mild sidewall narrowing in the hypopharyngeal region.  DESCRIPTION OF PROCEDURE:  The patient was identified in the holding room.  Informed consent having been obtained with discussion of risks, benefits and alternatives, the patient was brought to the operative suite and put on the table in supine position.   IV sedation was induced and the patient was brought into a simulated sleep level.  The flexible laryngoscope was passed through the right nasal passage to view the pharynx and larynx as described above.  Findings were noted as described above.  The  exam was recorded.  After this was completed, the scope was removed and he was returned to anesthesia for wakeup and was moved to the recovery room in stable condition.  VN/NUANCE  D:12/27/2019 T:12/27/2019 JOB:[id number]

## 2019-12-27 NOTE — H&P (Signed)
Devin Joseph is an 59 y.o. male.   Chief Complaint: OSA HPI: 59 year old male with OSA who has been unable to tolerate CPAP.  He presents for DISE.  AHI 24.4.  Past Medical History:  Diagnosis Date  . COPD (chronic obstructive pulmonary disease) (HCC)   . Depression   . GERD (gastroesophageal reflux disease)   . Suicidal behavior without attempted self-injury   . Thyroid disease     Past Surgical History:  Procedure Laterality Date  . CHOLECYSTECTOMY    . HERNIA REPAIR      History reviewed. No pertinent family history. Social History:  reports that he has quit smoking. He has never used smokeless tobacco. He reports that he does not drink alcohol or use drugs.  Allergies:  Allergies  Allergen Reactions  . Trazodone And Nefazodone Other (See Comments)    Causes muscle jerks/tremors per spouse  . Latex Rash    NO POWDERED GLOVES, please!!    Medications Prior to Admission  Medication Sig Dispense Refill  . atorvastatin (LIPITOR) 40 MG tablet Take 1 tablet (40 mg total) by mouth at bedtime. (Patient taking differently: Take 20 mg by mouth at bedtime. ) 30 tablet 0  . levothyroxine (SYNTHROID, LEVOTHROID) 112 MCG tablet Take 1 tablet (112 mcg total) by mouth daily before breakfast. For hypothyroidism    . risperiDONE (RISPERDAL) 0.5 MG tablet Take 1 mg by mouth 2 (two) times daily.     Marland Kitchen acetaminophen (TYLENOL) 500 MG tablet Take 1,000-1,500 mg by mouth every 6 (six) hours as needed for headache (pain).    Marland Kitchen asenapine (SAPHRIS) 5 MG SUBL 24 hr tablet Place 1 tablet (5 mg total) under the tongue 2 (two) times daily. (Patient not taking: Reported on 11/29/2018) 60 tablet 0  . carbamide peroxide (DEBROX) 6.5 % OTIC solution Place 2 drops into both ears every 30 (thirty) days.    . famotidine (PEPCID) 20 MG tablet Take 20 mg by mouth daily.    . hydrOXYzine (ATARAX/VISTARIL) 25 MG tablet Take 1 tablet (25 mg total) by mouth 3 (three) times daily as needed for anxiety. (Patient  not taking: Reported on 11/29/2018) 30 tablet 0    No results found for this or any previous visit (from the past 48 hour(s)). No results found.  Review of Systems  All other systems reviewed and are negative.   Blood pressure 118/80, pulse 89, temperature (!) 96.8 F (36 C), temperature source Tympanic, resp. rate 18, height 5\' 4"  (1.626 m), weight 87.1 kg, SpO2 98 %. Physical Exam  Constitutional: He is oriented to person, place, and time. He appears well-developed and well-nourished. No distress.  HENT:  Head: Normocephalic and atraumatic.  Right Ear: External ear normal.  Left Ear: External ear normal.  Nose: Nose normal.  Mouth/Throat: Oropharynx is clear and moist.  Eyes: Pupils are equal, round, and reactive to light. Conjunctivae and EOM are normal.  Cardiovascular: Normal rate.  Respiratory: Effort normal.  Musculoskeletal:     Cervical back: Normal range of motion and neck supple.  Neurological: He is alert and oriented to person, place, and time. No cranial nerve deficit.  Skin: Skin is warm and dry.  Psychiatric: He has a normal mood and affect. His behavior is normal. Judgment and thought content normal.     Assessment/Plan OSA  To OR for DISE.  , MD 12/27/2019, 8:35 AM

## 2019-12-27 NOTE — Anesthesia Postprocedure Evaluation (Signed)
Anesthesia Post Note  Patient: Devin Joseph  Procedure(s) Performed: DRUG INDUCED ENDOSCOPY (N/A Nose)     Patient location during evaluation: PACU Anesthesia Type: MAC Level of consciousness: awake and alert Pain management: pain level controlled Vital Signs Assessment: post-procedure vital signs reviewed and stable Respiratory status: spontaneous breathing, nonlabored ventilation, respiratory function stable and patient connected to nasal cannula oxygen Cardiovascular status: stable and blood pressure returned to baseline Postop Assessment: no apparent nausea or vomiting Anesthetic complications: no    Last Vitals:  Vitals:   12/27/19 0945 12/27/19 1000  BP: 110/80 112/86  Pulse: 78 88  Resp: 13 16  Temp:    SpO2: 97% 96%    Last Pain:  Vitals:   12/27/19 1000  TempSrc:   PainSc: 0-No pain                 Barnet Glasgow

## 2019-12-27 NOTE — Transfer of Care (Signed)
Immediate Anesthesia Transfer of Care Note  Patient: Devin Joseph  Procedure(s) Performed: DRUG INDUCED ENDOSCOPY (N/A Nose)  Patient Location: PACU  Anesthesia Type:MAC  Level of Consciousness: awake, alert  and oriented  Airway & Oxygen Therapy: Patient Spontanous Breathing and Patient connected to face mask oxygen  Post-op Assessment: Report given to RN and Post -op Vital signs reviewed and stable  Post vital signs: Reviewed and stable  Last Vitals:  Vitals Value Taken Time  BP 106/75 12/27/19 0935  Temp    Pulse 78 12/27/19 0939  Resp 16 12/27/19 0939  SpO2 97 % 12/27/19 0939  Vitals shown include unvalidated device data.  Last Pain:  Vitals:   12/27/19 0805  TempSrc: Tympanic  PainSc: 0-No pain         Complications: No apparent anesthesia complications

## 2019-12-27 NOTE — Discharge Instructions (Signed)

## 2019-12-27 NOTE — Brief Op Note (Signed)
12/27/2019  9:48 AM  PATIENT:  [name]  59 y.o. male  PRE-OPERATIVE DIAGNOSIS:  SLEEP APNEA  POST-OPERATIVE DIAGNOSIS:  SLEEP APNEA  PROCEDURE:  Procedure(s): DRUG INDUCED ENDOSCOPY (N/A)  SURGEON:  Surgeon(s) and Role:    * Christia Reading, MD - Primary  PHYSICIAN ASSISTANT:   ASSISTANTS: none   ANESTHESIA:   IV sedation  EBL:  0 mL   BLOOD ADMINISTERED:none  DRAINS: none   LOCAL MEDICATIONS USED:  NONE  SPECIMEN:  No Specimen  DISPOSITION OF SPECIMEN:  N/A  COUNTS:  YES  TOURNIQUET:  * No tourniquets in log *  DICTATION: .Other Dictation: Dictation Number [id number]  PLAN OF CARE: Discharge to home after PACU  PATIENT DISPOSITION:  PACU - hemodynamically stable.   Delay start of Pharmacological VTE agent (>24hrs) due to surgical blood loss or risk of bleeding: no

## 2019-12-28 ENCOUNTER — Encounter: Payer: Self-pay | Admitting: *Deleted

## 2020-08-02 ENCOUNTER — Other Ambulatory Visit: Payer: Self-pay | Admitting: Otolaryngology

## 2020-08-08 NOTE — Progress Notes (Signed)
[facility] - [place], [place] - [address] [place] [address] Phone: [phone] Fax: [phone]      Your procedure is scheduled on August 27  Report to [facility] Main Entrance "A" at 0825 A.M., and check in at the Admitting office.  Call this number if you have problems the morning of surgery:  [phone]  Call [phone] if you have any questions prior to your surgery date Monday-Friday 8am-4pm    Remember:  Do not eat or drink after midnight the night before your surgery     Take these medicines the morning of surgery with A SIP OF WATER  acetaminophen (TYLENOL buPROPion (WELLBUTRIN XL) famotidine (PEPCID) levothyroxine (SYNTHROID, LEVOTHROID) risperiDONE (RISPERDAL  As of today, STOP taking any Aspirin (unless otherwise instructed by your surgeon) Aleve, Naproxen, Ibuprofen, Motrin, Advil, Goody's, BC's, all herbal medications, fish oil, and all vitamins.                      Do not wear jewelry            Do not wear lotions, powders, colognes, or deodorant.            Men may shave face and neck.            Do not bring valuables to the hospital.            [facility] is not responsible for any belongings or valuables.  Do NOT Smoke (Tobacco/Vaping) or drink Alcohol 24 hours prior to your procedure If you use a CPAP at night, you may bring all equipment for your overnight stay.   Contacts, glasses, dentures or bridgework may not be worn into surgery.      For patients admitted to the hospital, discharge time will be determined by your treatment team.   Patients discharged the day of surgery will not be allowed to drive home, and someone needs to stay with them for 24 hours.    Special instructions:   [facility]- Preparing For Surgery  Before surgery, you can play an important role. Because skin is not sterile, your skin needs to be as free of germs as possible. You can reduce the number of germs on your  skin by washing with CHG (chlorahexidine gluconate) Soap before surgery.  CHG is an antiseptic cleaner which kills germs and bonds with the skin to continue killing germs even after washing.    Oral Hygiene is also important to reduce your risk of infection.  Remember - BRUSH YOUR TEETH THE MORNING OF SURGERY WITH YOUR REGULAR TOOTHPASTE  Please do not use if you have an allergy to CHG or antibacterial soaps. If your skin becomes reddened/irritated stop using the CHG.  Do not shave (including legs and underarms) for at least 48 hours prior to first CHG shower. It is OK to shave your face.  Please follow these instructions carefully.   1. Shower the NIGHT BEFORE SURGERY and the MORNING OF SURGERY with CHG Soap.   2. If you chose to wash your hair, wash your hair first as usual with your normal shampoo.  3. After you shampoo, rinse your hair and body thoroughly to remove the shampoo.  4. Use CHG as you would any other liquid soap. You can apply CHG directly to the skin and wash gently with a scrungie or a clean washcloth.   5. Apply the CHG Soap to your body ONLY FROM THE NECK  DOWN.  Do not use on open wounds or open sores. Avoid contact with your eyes, ears, mouth and genitals (private parts). Wash Face and genitals (private parts)  with your normal soap.   6. Wash thoroughly, paying special attention to the area where your surgery will be performed.  7. Thoroughly rinse your body with warm water from the neck down.  8. DO NOT shower/wash with your normal soap after using and rinsing off the CHG Soap.  9. Pat yourself dry with a CLEAN TOWEL.  10. Wear CLEAN PAJAMAS to bed the night before surgery  11. Place CLEAN SHEETS on your bed the night of your first shower and DO NOT SLEEP WITH PETS.   Day of Surgery: Wear Clean/Comfortable clothing the morning of surgery Do not apply any deodorants/lotions.   Remember to brush your teeth WITH YOUR REGULAR TOOTHPASTE.   Please read over the  following fact sheets that you were given.

## 2020-08-09 ENCOUNTER — Encounter (HOSPITAL_COMMUNITY)
Admission: RE | Admit: 2020-08-09 | Discharge: 2020-08-09 | Disposition: A | Discharge status: Home / Self Care | Payer: Medicare HMO | Source: Ambulatory Visit | Attending: Otolaryngology | Admitting: Otolaryngology

## 2020-08-09 ENCOUNTER — Other Ambulatory Visit: Payer: Self-pay

## 2020-08-09 ENCOUNTER — Other Ambulatory Visit (HOSPITAL_COMMUNITY)
Admission: RE | Admit: 2020-08-09 | Discharge: 2020-08-09 | Disposition: A | Discharge status: Home / Self Care | Payer: Medicare HMO | Source: Ambulatory Visit | Attending: Otolaryngology | Admitting: Otolaryngology

## 2020-08-09 ENCOUNTER — Encounter (HOSPITAL_COMMUNITY): Payer: Self-pay

## 2020-08-09 ENCOUNTER — Encounter (HOSPITAL_BASED_OUTPATIENT_CLINIC_OR_DEPARTMENT_OTHER): Payer: Self-pay | Admitting: Otolaryngology

## 2020-08-09 DIAGNOSIS — Z01812 Encounter for preprocedural laboratory examination: Secondary | ICD-10-CM | POA: Insufficient documentation

## 2020-08-09 DIAGNOSIS — Z20822 Contact with and (suspected) exposure to covid-19: Secondary | ICD-10-CM | POA: Insufficient documentation

## 2020-08-09 HISTORY — DX: Dyspnea, unspecified: R06.00

## 2020-08-09 HISTORY — DX: Chronic kidney disease, unspecified: N18.9

## 2020-08-09 HISTORY — DX: Pneumonia, unspecified organism: J18.9

## 2020-08-09 LAB — CBC
HCT: 47.4 % (ref 39.0–52.0)
Hemoglobin: 15.1 g/dL (ref 13.0–17.0)
MCH: 29.2 pg (ref 26.0–34.0)
MCHC: 31.9 g/dL (ref 30.0–36.0)
MCV: 91.5 fL (ref 80.0–100.0)
Platelets: 140 10*3/uL — ABNORMAL LOW (ref 150–400)
RBC: 5.18 MIL/uL (ref 4.22–5.81)
RDW: 13.2 % (ref 11.5–15.5)
WBC: 6.5 10*3/uL (ref 4.0–10.5)
nRBC: 0 % (ref 0.0–0.2)

## 2020-08-09 LAB — SARS CORONAVIRUS 2 (TAT 6-24 HRS): SARS Coronavirus 2: NEGATIVE

## 2020-08-09 NOTE — Progress Notes (Signed)
PCP:  Liberty Handy, DO Cardiologist:  Denies  EKG:  N/A CXR:  N/A ECHO:  Denies Stress Test:  Denies Cardiac Cath:  Denies  Covid test 08/09/20  Patient denies shortness of breath, fever, cough, and chest pain at PAT appointment.  Patient verbalized understanding of instructions provided today at the PAT appointment.  Patient asked to review instructions at home and day of surgery.

## 2020-08-11 ENCOUNTER — Ambulatory Visit (HOSPITAL_BASED_OUTPATIENT_CLINIC_OR_DEPARTMENT_OTHER): Payer: Medicare HMO | Admitting: Certified Registered"

## 2020-08-11 ENCOUNTER — Encounter (HOSPITAL_BASED_OUTPATIENT_CLINIC_OR_DEPARTMENT_OTHER)
Admission: RE | Disposition: A | Discharge status: Home / Self Care | Payer: Self-pay | Source: Home / Self Care | Attending: Otolaryngology

## 2020-08-11 ENCOUNTER — Encounter (HOSPITAL_BASED_OUTPATIENT_CLINIC_OR_DEPARTMENT_OTHER): Payer: Self-pay | Admitting: Otolaryngology

## 2020-08-11 ENCOUNTER — Ambulatory Visit (HOSPITAL_BASED_OUTPATIENT_CLINIC_OR_DEPARTMENT_OTHER)
Admission: RE | Admit: 2020-08-11 | Discharge: 2020-08-11 | Disposition: A | Discharge status: Home / Self Care | Payer: Medicare HMO | Attending: Otolaryngology | Admitting: Otolaryngology

## 2020-08-11 ENCOUNTER — Other Ambulatory Visit: Payer: Self-pay

## 2020-08-11 ENCOUNTER — Ambulatory Visit (HOSPITAL_COMMUNITY): Payer: Medicare HMO

## 2020-08-11 DIAGNOSIS — N189 Chronic kidney disease, unspecified: Secondary | ICD-10-CM | POA: Insufficient documentation

## 2020-08-11 DIAGNOSIS — Z9049 Acquired absence of other specified parts of digestive tract: Secondary | ICD-10-CM | POA: Insufficient documentation

## 2020-08-11 DIAGNOSIS — Z79899 Other long term (current) drug therapy: Secondary | ICD-10-CM | POA: Diagnosis not present

## 2020-08-11 DIAGNOSIS — J449 Chronic obstructive pulmonary disease, unspecified: Secondary | ICD-10-CM | POA: Diagnosis not present

## 2020-08-11 DIAGNOSIS — E669 Obesity, unspecified: Secondary | ICD-10-CM | POA: Insufficient documentation

## 2020-08-11 DIAGNOSIS — F8081 Childhood onset fluency disorder: Secondary | ICD-10-CM | POA: Diagnosis not present

## 2020-08-11 DIAGNOSIS — Z888 Allergy status to other drugs, medicaments and biological substances status: Secondary | ICD-10-CM | POA: Insufficient documentation

## 2020-08-11 DIAGNOSIS — E039 Hypothyroidism, unspecified: Secondary | ICD-10-CM | POA: Insufficient documentation

## 2020-08-11 DIAGNOSIS — G4733 Obstructive sleep apnea (adult) (pediatric): Secondary | ICD-10-CM | POA: Diagnosis present

## 2020-08-11 DIAGNOSIS — F329 Major depressive disorder, single episode, unspecified: Secondary | ICD-10-CM | POA: Insufficient documentation

## 2020-08-11 DIAGNOSIS — Z905 Acquired absence of kidney: Secondary | ICD-10-CM | POA: Insufficient documentation

## 2020-08-11 DIAGNOSIS — Z6833 Body mass index (BMI) 33.0-33.9, adult: Secondary | ICD-10-CM | POA: Insufficient documentation

## 2020-08-11 DIAGNOSIS — F17210 Nicotine dependence, cigarettes, uncomplicated: Secondary | ICD-10-CM | POA: Insufficient documentation

## 2020-08-11 DIAGNOSIS — Z91040 Latex allergy status: Secondary | ICD-10-CM | POA: Insufficient documentation

## 2020-08-11 DIAGNOSIS — F419 Anxiety disorder, unspecified: Secondary | ICD-10-CM | POA: Insufficient documentation

## 2020-08-11 DIAGNOSIS — F603 Borderline personality disorder: Secondary | ICD-10-CM | POA: Insufficient documentation

## 2020-08-11 DIAGNOSIS — K219 Gastro-esophageal reflux disease without esophagitis: Secondary | ICD-10-CM | POA: Diagnosis not present

## 2020-08-11 HISTORY — DX: Hypothyroidism, unspecified: E03.9

## 2020-08-11 HISTORY — DX: Anxiety disorder, unspecified: F41.9

## 2020-08-11 HISTORY — DX: Sleep apnea, unspecified: G47.30

## 2020-08-11 HISTORY — PX: IMPLANTATION OF HYPOGLOSSAL NERVE STIMULATOR: SHX6827

## 2020-08-11 SURGERY — INSERTION, HYPOGLOSSAL NERVE STIMULATOR
Anesthesia: General | Site: Neck | Laterality: Right

## 2020-08-11 MED ORDER — PHENYLEPHRINE 40 MCG/ML (10ML) SYRINGE FOR IV PUSH (FOR BLOOD PRESSURE SUPPORT)
PREFILLED_SYRINGE | INTRAVENOUS | Status: AC
  Filled 2020-08-11: qty 30

## 2020-08-11 MED ORDER — HYDROCODONE-ACETAMINOPHEN 5-325 MG PO TABS: 1.0000 | ORAL_TABLET | Freq: Four times a day (QID) | ORAL | 0 refills | Status: DC | PRN

## 2020-08-11 MED ORDER — EPHEDRINE 5 MG/ML INJ
INTRAVENOUS | Status: AC
  Filled 2020-08-11: qty 20

## 2020-08-11 MED ORDER — ONDANSETRON HCL 4 MG/2ML IJ SOLN
INTRAMUSCULAR | Status: DC | PRN
  Administered 2020-08-11: 4 mg via INTRAVENOUS

## 2020-08-11 MED ORDER — PROPOFOL 10 MG/ML IV BOLUS
INTRAVENOUS | Status: DC | PRN
  Administered 2020-08-11: 150 mg via INTRAVENOUS

## 2020-08-11 MED ORDER — CEFAZOLIN SODIUM-DEXTROSE 2-4 GM/100ML-% IV SOLN
2.0000 g | INTRAVENOUS | Status: AC
  Administered 2020-08-11: 2 g via INTRAVENOUS

## 2020-08-11 MED ORDER — LIDOCAINE-EPINEPHRINE 1 %-1:100000 IJ SOLN
INTRAMUSCULAR | Status: DC | PRN
  Administered 2020-08-11: 5 mL

## 2020-08-11 MED ORDER — MIDAZOLAM HCL 5 MG/5ML IJ SOLN
INTRAMUSCULAR | Status: DC | PRN
  Administered 2020-08-11: 2 mg via INTRAVENOUS

## 2020-08-11 MED ORDER — PROPOFOL 500 MG/50ML IV EMUL
INTRAVENOUS | Status: DC | PRN
  Administered 2020-08-11: 25 ug/kg/min via INTRAVENOUS

## 2020-08-11 MED ORDER — SUCCINYLCHOLINE CHLORIDE 20 MG/ML IJ SOLN
INTRAMUSCULAR | Status: DC | PRN
  Administered 2020-08-11: 160 mg via INTRAVENOUS

## 2020-08-11 MED ORDER — FENTANYL CITRATE (PF) 100 MCG/2ML IJ SOLN
INTRAMUSCULAR | Status: AC
  Filled 2020-08-11: qty 2

## 2020-08-11 MED ORDER — ACETAMINOPHEN 10 MG/ML IV SOLN
1000.0000 mg | Freq: Once | INTRAVENOUS | Status: DC | PRN
  Administered 2020-08-11: 1000 mg via INTRAVENOUS

## 2020-08-11 MED ORDER — LACTATED RINGERS IV SOLN: INTRAVENOUS | Status: DC

## 2020-08-11 MED ORDER — MIDAZOLAM HCL 2 MG/2ML IJ SOLN
INTRAMUSCULAR | Status: AC
  Filled 2020-08-11: qty 2

## 2020-08-11 MED ORDER — DEXAMETHASONE SODIUM PHOSPHATE 4 MG/ML IJ SOLN
INTRAMUSCULAR | Status: DC | PRN
  Administered 2020-08-11: 10 mg via INTRAVENOUS

## 2020-08-11 MED ORDER — 0.9 % SODIUM CHLORIDE (POUR BTL) OPTIME
TOPICAL | Status: DC | PRN
  Administered 2020-08-11: 1000 mL

## 2020-08-11 MED ORDER — SODIUM CHLORIDE 0.9 % IV SOLN
INTRAVENOUS | Status: DC | PRN
  Administered 2020-08-11: 500 mL

## 2020-08-11 MED ORDER — PROMETHAZINE HCL 25 MG/ML IJ SOLN: 6.2500 mg | INTRAMUSCULAR | Status: DC | PRN

## 2020-08-11 MED ORDER — LIDOCAINE HCL (CARDIAC) PF 100 MG/5ML IV SOSY
PREFILLED_SYRINGE | INTRAVENOUS | Status: DC | PRN
  Administered 2020-08-11: 60 mg via INTRAVENOUS

## 2020-08-11 MED ORDER — FENTANYL CITRATE (PF) 100 MCG/2ML IJ SOLN
INTRAMUSCULAR | Status: DC | PRN
  Administered 2020-08-11: 50 ug via INTRAVENOUS
  Administered 2020-08-11: 25 ug via INTRAVENOUS
  Administered 2020-08-11: 50 ug via INTRAVENOUS

## 2020-08-11 MED ORDER — ACETAMINOPHEN 10 MG/ML IV SOLN
INTRAVENOUS | Status: AC
  Filled 2020-08-11: qty 100

## 2020-08-11 MED ORDER — FENTANYL CITRATE (PF) 100 MCG/2ML IJ SOLN: 25.0000 ug | INTRAMUSCULAR | Status: DC | PRN

## 2020-08-11 MED ORDER — PHENYLEPHRINE HCL (PRESSORS) 10 MG/ML IV SOLN
INTRAVENOUS | Status: DC | PRN
  Administered 2020-08-11: 80 ug via INTRAVENOUS
  Administered 2020-08-11 (×4): 40 ug via INTRAVENOUS
  Administered 2020-08-11: 80 ug via INTRAVENOUS

## 2020-08-11 MED ORDER — CEFAZOLIN SODIUM-DEXTROSE 2-4 GM/100ML-% IV SOLN
INTRAVENOUS | Status: AC
  Filled 2020-08-11: qty 100

## 2020-08-11 MED ORDER — PHENYLEPHRINE 40 MCG/ML (10ML) SYRINGE FOR IV PUSH (FOR BLOOD PRESSURE SUPPORT)
PREFILLED_SYRINGE | INTRAVENOUS | Status: AC
  Filled 2020-08-11: qty 20

## 2020-08-11 SURGICAL SUPPLY — 71 items
ACC NRSTM 4 TRQ WRNCH STRL (MISCELLANEOUS)
ADH SKN CLS APL DERMABOND .7 (GAUZE/BANDAGES/DRESSINGS) ×2
BAG DECANTER FOR FLEXI CONT (MISCELLANEOUS) ×2 IMPLANT
BLADE CLIPPER SURG (BLADE) ×2 IMPLANT
BLADE SURG 15 STRL LF DISP TIS (BLADE) ×2 IMPLANT
BLADE SURG 15 STRL SS (BLADE) ×4
CANISTER SUCT 1200ML W/VALVE (MISCELLANEOUS) ×2 IMPLANT
CORD BIPOLAR FORCEPS 12FT (ELECTRODE) ×2 IMPLANT
COVER PROBE W GEL 5X96 (DRAPES) ×2 IMPLANT
COVER WAND RF STERILE (DRAPES) IMPLANT
DERMABOND ADVANCED (GAUZE/BANDAGES/DRESSINGS) ×2
DERMABOND ADVANCED .7 DNX12 (GAUZE/BANDAGES/DRESSINGS) ×2 IMPLANT
DRAPE C-ARM 35X43 STRL (DRAPES) IMPLANT
DRAPE HEAD BAR (DRAPES) IMPLANT
DRAPE INCISE IOBAN 66X45 STRL (DRAPES) ×2 IMPLANT
DRAPE MICROSCOPE WILD 40.5X102 (DRAPES) ×2 IMPLANT
DRAPE UTILITY XL STRL (DRAPES) ×2 IMPLANT
DRSG TEGADERM 2-3/8X2-3/4 SM (GAUZE/BANDAGES/DRESSINGS) ×4 IMPLANT
DRSG TEGADERM 4X4.75 (GAUZE/BANDAGES/DRESSINGS) ×4 IMPLANT
ELECT COATED BLADE 2.86 ST (ELECTRODE) ×2 IMPLANT
ELECT EMG 18 NIMS (NEUROSURGERY SUPPLIES) ×2
ELECT REM PT RETURN 9FT ADLT (ELECTROSURGICAL) ×2
ELECTRODE EMG 18 NIMS (NEUROSURGERY SUPPLIES) ×1 IMPLANT
ELECTRODE REM PT RTRN 9FT ADLT (ELECTROSURGICAL) ×1 IMPLANT
FORCEPS BIPOLAR SPETZLER 8 1.0 (NEUROSURGERY SUPPLIES) ×2 IMPLANT
GAUZE 4X4 16PLY RFD (DISPOSABLE) ×2 IMPLANT
GAUZE SPONGE 4X4 12PLY STRL (GAUZE/BANDAGES/DRESSINGS) ×2 IMPLANT
GENERATOR PULSE INSPIRE (Generator) ×2 IMPLANT
GLOVE BIOGEL PI IND STRL 7.5 (GLOVE) ×1 IMPLANT
GLOVE BIOGEL PI INDICATOR 7.5 (GLOVE) ×1
GLOVE ECLIPSE 7.5 STRL STRAW (GLOVE) IMPLANT
GLOVE SURG SS PI 6.5 STRL IVOR (GLOVE) ×2 IMPLANT
GLOVE SURG SS PI 7.0 STRL IVOR (GLOVE) ×2 IMPLANT
GLOVE SURG SS PI 7.5 STRL IVOR (GLOVE) ×2 IMPLANT
GLOVE SURG SYN 7.5  E (GLOVE)
GLOVE SURG SYN 7.5 E (GLOVE) IMPLANT
GOWN STRL REUS W/ TWL LRG LVL3 (GOWN DISPOSABLE) ×3 IMPLANT
GOWN STRL REUS W/TWL LRG LVL3 (GOWN DISPOSABLE) ×6
IV CATH 18G SAFETY (IV SOLUTION) ×2 IMPLANT
KIT NEURO ACCESSORY W/WRENCH (MISCELLANEOUS) IMPLANT
LEAD SENSING RESP INSPIRE (Lead) ×2 IMPLANT
LEAD SLEEP STIMULATION INSPIRE (Lead) ×2 IMPLANT
LOOP VESSEL MAXI BLUE (MISCELLANEOUS) ×2 IMPLANT
LOOP VESSEL MINI RED (MISCELLANEOUS) ×2 IMPLANT
MARKER SKIN DUAL TIP RULER LAB (MISCELLANEOUS) ×4 IMPLANT
NEEDLE HYPO 25X1 1.5 SAFETY (NEEDLE) ×2 IMPLANT
NS IRRIG 1000ML POUR BTL (IV SOLUTION) ×2 IMPLANT
PACK BASIN DAY SURGERY FS (CUSTOM PROCEDURE TRAY) ×2 IMPLANT
PACK ENT DAY SURGERY (CUSTOM PROCEDURE TRAY) ×2 IMPLANT
PASSER CATH 36 CODMAN DISP (NEUROSURGERY SUPPLIES) IMPLANT
PASSER CATH 38CM DISP (INSTRUMENTS) IMPLANT
PENCIL SMOKE EVACUATOR (MISCELLANEOUS) ×2 IMPLANT
PROBE NERVE STIMULATOR (NEUROSURGERY SUPPLIES) ×2 IMPLANT
REMOTE CONTROL SLEEP INSPIRE (MISCELLANEOUS) ×2 IMPLANT
SET WALTER ACTIVATION W/DRAPE (SET/KITS/TRAYS/PACK) ×2 IMPLANT
SLING ARM FOAM STRAP LRG (SOFTGOODS) ×2 IMPLANT
SLING ARM FOAM STRAP MED (SOFTGOODS) IMPLANT
SPONGE INTESTINAL PEANUT (DISPOSABLE) ×2 IMPLANT
STAPLER VISISTAT 35W (STAPLE) IMPLANT
SUT SILK 2 0 SH (SUTURE) ×2 IMPLANT
SUT SILK 3 0 REEL (SUTURE) ×2 IMPLANT
SUT SILK 3 0 SH 30 (SUTURE) ×8 IMPLANT
SUT SILK 3-0 (SUTURE) ×2
SUT SILK 3-0 RB1 30XBRD (SUTURE) ×1
SUT VIC AB 3-0 SH 27 (SUTURE) ×2
SUT VIC AB 3-0 SH 27X BRD (SUTURE) ×1 IMPLANT
SUT VIC AB 4-0 PS2 27 (SUTURE) ×4 IMPLANT
SUTURE SILK 3-0 RB1 30XBRD (SUTURE) ×1 IMPLANT
SYR 10ML LL (SYRINGE) ×2 IMPLANT
SYR BULB EAR ULCER 3OZ GRN STR (SYRINGE) ×2 IMPLANT
TOWEL GREEN STERILE FF (TOWEL DISPOSABLE) ×4 IMPLANT

## 2020-08-11 NOTE — Transfer of Care (Signed)
Immediate Anesthesia Transfer of Care Note  Patient: Devin Joseph  Procedure(s) Performed: IMPLANTATION OF HYPOGLOSSAL NERVE STIMULATOR (Right Neck)  Patient Location: PACU  Anesthesia Type:General  Level of Consciousness: drowsy and patient cooperative  Airway & Oxygen Therapy: Patient Spontanous Breathing and Patient connected to face mask oxygen  Post-op Assessment: Report given to RN and Post -op Vital signs reviewed and stable  Post vital signs: Reviewed and stable  Last Vitals:  Vitals Value Taken Time  BP 142/100 08/11/20 1318  Temp    Pulse 107 08/11/20 1323  Resp 16 08/11/20 1323  SpO2 100 % 08/11/20 1323  Vitals shown include unvalidated device data.  Last Pain:  Vitals:   08/11/20 0845  TempSrc: Oral  PainSc: 0-No pain         Complications: No complications documented.

## 2020-08-11 NOTE — Op Note (Signed)

## 2020-08-11 NOTE — Discharge Instructions (Signed)

## 2020-08-11 NOTE — Anesthesia Preprocedure Evaluation (Addendum)
Anesthesia Evaluation  Patient identified by MRN, date of birth, ID band Patient awake    Reviewed: Allergy & Precautions, NPO status , Patient's Chart, lab work & pertinent test results  Airway Mallampati: III  TM Distance: >3 FB Neck ROM: Full   Comment: Narrow palate beard Dental  (+) Poor Dentition   Pulmonary sleep apnea , COPD, Current Smoker,    Pulmonary exam normal breath sounds clear to auscultation       Cardiovascular Exercise Tolerance: Good negative cardio ROS Normal cardiovascular exam Rhythm:Regular Rate:Normal     Neuro/Psych PSYCHIATRIC DISORDERS Anxiety Depression Borderline personality disordernegative neurological ROS     GI/Hepatic Neg liver ROS, GERD  ,  Endo/Other  Hypothyroidism obesity  Renal/GU Renal InsufficiencyRenal disease (one kidney)  negative genitourinary   Musculoskeletal negative musculoskeletal ROS (+)   Abdominal   Peds negative pediatric ROS (+)  Hematology negative hematology ROS (+)   Anesthesia Other Findings Stuttering and motor tics 2/2 anxiety and medications  Reproductive/Obstetrics negative OB ROS                            Anesthesia Physical Anesthesia Plan  ASA: III  Anesthesia Plan: General   Post-op Pain Management:    Induction: Intravenous  PONV Risk Score and Plan: 1 and Midazolam, Ondansetron and Dexamethasone  Airway Management Planned: Oral ETT and Video Laryngoscope Planned  Additional Equipment:   Intra-op Plan:   Post-operative Plan:   Informed Consent: I have reviewed the patients History and Physical, chart, labs and discussed the procedure including the risks, benefits and alternatives for the proposed anesthesia with the patient or authorized representative who has indicated his/her understanding and acceptance.     Dental advisory given  Plan Discussed with: CRNA and Anesthesiologist  Anesthesia Plan  Comments: (Glidescope for intubation given narrowness of palate)      Anesthesia Quick Evaluation

## 2020-08-11 NOTE — H&P (Signed)
Devin Joseph is an 59 y.o. male.   Chief Complaint: Sleep apnea HPI: 59 year old male with obstructive sleep apnea unable to tolerate CPAP.  He presents for surgical management.  Past Medical History:  Diagnosis Date  . Anxiety   . Chronic kidney disease    removed 1 kidney  . COPD (chronic obstructive pulmonary disease) (HCC)   . Depression   . Dyspnea   . GERD (gastroesophageal reflux disease)   . Hypothyroidism   . Pneumonia   . Sleep apnea    does not use CPAP  . Suicidal behavior without attempted self-injury   . Thyroid disease     Past Surgical History:  Procedure Laterality Date  . CHOLECYSTECTOMY    . DRUG INDUCED ENDOSCOPY N/A 12/27/2019   Procedure: DRUG INDUCED ENDOSCOPY;  Surgeon: Christia Reading, MD;  Location: [facility];  Service: ENT;  Laterality: N/A;  . HERNIA REPAIR    . NEPHRECTOMY      History reviewed. No pertinent family history. Social History:  reports that he has been smoking cigarettes. He has never used smokeless tobacco. He reports current alcohol use. He reports that he does not use drugs.  Allergies:  Allergies  Allergen Reactions  . Trazodone And Nefazodone Other (See Comments)    Causes muscle jerks/tremors per spouse  . Latex Rash    NO POWDERED GLOVES, please!!    Medications Prior to Admission  Medication Sig Dispense Refill  . atorvastatin (LIPITOR) 40 MG tablet Take 1 tablet (40 mg total) by mouth at bedtime. (Patient taking differently: Take 20 mg by mouth at bedtime. ) 30 tablet 0  . buPROPion (WELLBUTRIN XL) 150 MG 24 hr tablet Take 150 mg by mouth daily.    . carbamide peroxide (DEBROX) 6.5 % OTIC solution Place 2 drops into both ears every 30 (thirty) days.    . famotidine (PEPCID) 20 MG tablet Take 20 mg by mouth daily.    Marland Kitchen levothyroxine (SYNTHROID, LEVOTHROID) 112 MCG tablet Take 1 tablet (112 mcg total) by mouth daily before breakfast. For hypothyroidism    . Pramox-PE-Glycerin-Petrolatum (HEMORRHOIDAL  EX) Place 1 application rectally 4 (four) times daily as needed (hemorroids/discomfort).    . psyllium (METAMUCIL) 58.6 % packet Take 1 packet by mouth at bedtime.    . risperiDONE (RISPERDAL) 1 MG tablet Take 1 mg by mouth 2 (two) times daily.    Marland Kitchen acetaminophen (TYLENOL) 500 MG tablet Take 1,000-1,500 mg by mouth every 6 (six) hours as needed for headache (pain).    . ondansetron (ZOFRAN-ODT) 4 MG disintegrating tablet Take 4 mg by mouth every 8 (eight) hours as needed.    Bertram Gala Glycol-Propyl Glycol (LUBRICANT EYE DROPS) 0.4-0.3 % SOLN Place 1 drop into both eyes daily as needed (dry/irritated eyes.).      No results found for this or any previous visit (from the past 48 hour(s)). No results found.  Review of Systems  All other systems reviewed and are negative.   Blood pressure 122/73, pulse 100, temperature 97.8 F (36.6 C), temperature source Oral, resp. rate 18, height 5\' 4"  (1.626 m), weight 88.1 kg, SpO2 97 %. Physical Exam Constitutional:      Appearance: Normal appearance. He is normal weight.  HENT:     Head: Normocephalic and atraumatic.     Right Ear: External ear normal.     Left Ear: External ear normal.     Nose: Nose normal.     Mouth/Throat:  Mouth: Mucous membranes are moist.     Pharynx: Oropharynx is clear.  Eyes:     Extraocular Movements: Extraocular movements intact.     Conjunctiva/sclera: Conjunctivae normal.     Pupils: Pupils are equal, round, and reactive to light.  Cardiovascular:     Rate and Rhythm: Normal rate.  Pulmonary:     Effort: Pulmonary effort is normal.  Skin:    General: Skin is warm and dry.  Neurological:     General: No focal deficit present.     Mental Status: He is alert and oriented to person, place, and time.  Psychiatric:        Mood and Affect: Mood normal.        Behavior: Behavior normal.        Thought Content: Thought content normal.        Judgment: Judgment normal.      Assessment/Plan Obstructive  sleep apnea  To OR for hypoglossal nerve stimulator placement.  Christia Reading, MD 08/11/2020, 10:44 AM

## 2020-08-11 NOTE — Anesthesia Procedure Notes (Signed)
Procedure Name: Intubation Date/Time: 08/11/2020 10:58 AM Performed by: Sheryn Bison, CRNA Pre-anesthesia Checklist: Patient identified, Emergency Drugs available, Suction available and Patient being monitored Patient Re-evaluated:Patient Re-evaluated prior to induction Oxygen Delivery Method: Circle system utilized Preoxygenation: Pre-oxygenation with 100% oxygen Induction Type: IV induction Ventilation: Mask ventilation without difficulty Laryngoscope Size: Glidescope and 3 Grade View: Grade II Nasal Tubes: Nasal prep performed and Nasal Rae Tube size: 7.0 mm Number of attempts: 1 Placement Confirmation: ETT inserted through vocal cords under direct vision,  positive ETCO2 and breath sounds checked- equal and bilateral Secured at: 23 cm Tube secured with: Tape Dental Injury: Teeth and Oropharynx as per pre-operative assessment  Difficulty Due To: Difficulty was anticipated Comments: Patient with very narrow upper palate, protruding front upper teeth, small oral opening,  And beard

## 2020-08-11 NOTE — Brief Op Note (Signed)
08/11/2020  12:42 PM  PATIENT:  Devin Joseph  59 y.o. male  PRE-OPERATIVE DIAGNOSIS:  sleep apena  POST-OPERATIVE DIAGNOSIS:  sleep apena  PROCEDURE:  Procedure(s) with comments: IMPLANTATION OF HYPOGLOSSAL NERVE STIMULATOR (Right) - REQUESTING RNFA  SURGEON:  Surgeon(s) and Role:    Christia Reading, MD - Primary  PHYSICIAN ASSISTANT:   ASSISTANTS: none   ANESTHESIA:   general  EBL: Minimal  BLOOD ADMINISTERED:none  DRAINS: none   LOCAL MEDICATIONS USED:  LIDOCAINE   SPECIMEN:  No Specimen  DISPOSITION OF SPECIMEN:  N/A  COUNTS:  YES  TOURNIQUET:  * No tourniquets in log *  DICTATION: .Note written in EPIC  PLAN OF CARE: Discharge to home after PACU  PATIENT DISPOSITION:  PACU - hemodynamically stable.   Delay start of Pharmacological VTE agent (>24hrs) due to surgical blood loss or risk of bleeding: no

## 2020-08-14 ENCOUNTER — Encounter (HOSPITAL_BASED_OUTPATIENT_CLINIC_OR_DEPARTMENT_OTHER): Payer: Self-pay | Admitting: Otolaryngology

## 2020-08-14 NOTE — Anesthesia Postprocedure Evaluation (Signed)
Anesthesia Post Note  Patient: Devin Joseph  Procedure(s) Performed: IMPLANTATION OF HYPOGLOSSAL NERVE STIMULATOR (Right Neck)     Patient location during evaluation: Short Stay Anesthesia Type: General Level of consciousness: awake and alert Pain management: pain level controlled Vital Signs Assessment: post-procedure vital signs reviewed and stable Respiratory status: spontaneous breathing, nonlabored ventilation, respiratory function stable and patient connected to nasal cannula oxygen Cardiovascular status: blood pressure returned to baseline and stable Postop Assessment: no apparent nausea or vomiting Anesthetic complications: no   No complications documented.  Last Vitals:  Vitals:   08/11/20 1509 08/11/20 1522  BP:  116/80  Pulse: (!) 109 (!) 105  Resp: 16 18  Temp:  36.6 C  SpO2: 96% 97%    Last Pain:  Vitals:   08/11/20 1522  TempSrc: Oral  PainSc: 0-No pain   Pain Goal:                   Mellody Dance

## 2020-09-13 ENCOUNTER — Ambulatory Visit: Payer: Medicare HMO | Admitting: Neurology

## 2020-09-13 ENCOUNTER — Encounter: Payer: Self-pay | Admitting: Neurology

## 2020-09-13 VITALS — BP 118/86 | HR 76 | Ht 64.0 in | Wt 192.0 lb

## 2020-09-13 DIAGNOSIS — Z789 Other specified health status: Secondary | ICD-10-CM | POA: Diagnosis not present

## 2020-09-13 DIAGNOSIS — Z459 Encounter for adjustment and management of unspecified implanted device: Secondary | ICD-10-CM | POA: Diagnosis not present

## 2020-09-13 DIAGNOSIS — G4733 Obstructive sleep apnea (adult) (pediatric): Secondary | ICD-10-CM

## 2020-09-13 NOTE — Progress Notes (Signed)
SLEEP MEDICINE CLINIC    Provider:  Melvyn Novas, MD  Primary Care Physician:  Si Gaul, DO [address] [place] [address]     I heard about inspire on TV  Referring Provider: Dr Jenne Pane, MD         Chief Complaint according to patient   Patient presents with:     New Patient (Initial Visit)           HISTORY OF PRESENT ILLNESS:  BOB DAVERSA is a 59 y.o. year old  Caucasian male patient seen on 09/13/2020 from Dr Jenne Pane, MD, ENT> [facility] consultation. Chief concern according to patient :   Mr. Jacqulyn Ducking reports that he is a patient suffering from obstructive sleep apnea he underwent a sleep study many many years ago.  He could not remember at what date but over a decade ago he was tested for apnea.  Apparently he was not able to tolerate CPAP.  He was then evaluated by ENT for the inspire device implant.  He needs to have to undergo another repeat sleep study to rule out significant central apneas.  This will detect if he has any barriers to benefit from inspire therapy.  He has excessive daytime sleepiness but he is also hypersomniac sleeping 10 to 12 hours a day.  In June 2021 he was hospitalized for pneumonia.  He still has coughing intermittent and it also interrupts his sleep as to nocturia.  I found history of a sleep study "it was a result of an AHI of 25 without significant central events.  The sleep study was not done in our lab.  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  It  is listed here that he actually had a sleep study just in 2020 which is not what he told me.  I reviewed his medications as well.  Risperidone, famotidine-Pepcid Synthroid, Wellbutrin, Zithromax, Lipitor Tylenol as needed.   I have the pleasure of seeing ERVIN HENSLEY today, a right -handed  Caucasian male , who has a past medical history of GAD/ Anxiety, Chronic kidney disease, COPD (chronic obstructive pulmonary disease) (HCC), Depression, Dyspnea, GERD (gastroesophageal reflux disease), Hypothyroidism, Pneumonia, Sleep apnea, Suicidal behavior without attempted self-injury, and Thyroid disease..   and ongoing exposure to cigarette smoke - living with mother (34), a smoker. He is a former smoker-  The patient had the first sleep study in the year 2005(?)- it was at  [facility].  He was prescribed a CPAP but couldn't use it- it caused dryness in mouth and nose. Sleep relevant medical history: Nocturia times 2-3 , no Tonsillectomy,he is chronically suffering from nasal congestion.   Family medical /sleep history: no other family member on CPAP with OSA, insomnia, sleep walkers.    Social history:  Patient is disabled - Corporate investment banker, Buyer, retail. HS graduate.  and lives in a household with wife and mother.   Pets are present.a dog and a cat.  Tobacco use- former and ongoing passive exposure .  ETOH use ; rare, sober for 4 years,  Caffeine intake in form of  Coffee( 2 cup) Soda( sometimes) Tea ( sometimes) or energy drinks. Regular exercise -none      Sleep habits are as follows: The patient's dinner time is between 6.30 PM. The patient goes to bed at 10.30 PM and continues to sleep in intervals of 2-4 hours, wakes for 2-3 bathroom breaks.   The preferred sleep position is on his right side , with the support of 3 pillows.he needs to elevate his head chest.  Dreams are reportedly rare.  8 AM is the usual rise time. The patient wakes up spontaneously at 7 AM .  He reports not  commonly feeling refreshed or restored in AM, with symptoms such as dry mouth, coughing , morning headaches, and residual fatigue.  Naps are taken frequently, lasting from 4-6 hours !!!   Total sleep time in 24 hours would be more than 11 hours ! .    Review of Systems: Out of a complete 14 system review, the patient complains of only the following symptoms, and all other reviewed systems are negative.:  Fatigue, sleepiness , snoring, fragmented sleep, nocturia, coughing spells, rhinitis. Snorer.    How likely are you to doze in the following situations: 0 = not likely, 1 = slight chance, 2 = moderate chance, 3 = high chance   Sitting and Reading? Watching Television? Sitting inactive in a public place (theater or meeting)? As a passenger in a car for an hour without a break? Lying down in the afternoon when circumstances permit? Sitting and talking to someone? Sitting quietly after lunch without alcohol? In a car, while stopped for a few minutes in traffic?   Total = 21/ 24 points   FSS endorsed at 43/ 63 points. High degree of fatigue, high degree of anxiety and depression.    Social History   Socioeconomic History   Marital status: Married    Spouse name: Not on file   Number of children: Not on file   Years of education: Not on file   Highest education level: Not on file  Occupational History   Not on file  Tobacco Use   Smoking status: Current Some Day Smoker    Types: Cigarettes   Smokeless tobacco: Never Used  Vaping Use   Vaping Use: Never used  Substance and Sexual Activity   Alcohol use: Yes    Comment: occ   Drug use: No   Sexual activity: Yes    Birth control/protection: None  Other Topics Concern   Not on file  Social History Narrative   Not on file   Social Determinants of Health   Financial Resource Strain:    Difficulty of Paying Living Expenses: Not on file  Food Insecurity:    Worried About Running Out of Food in the Last Year:  Not on file   [employer] of Food in the Last Year: Not on file  Transportation Needs:    Lack of Transportation (Medical): Not on file   Lack of Transportation (Non-Medical): Not on file  Physical Activity:    Days of Exercise per Week: Not on file   Minutes of Exercise per Session: Not on file  Stress:    Feeling of Stress : Not on file  Social Connections:    Frequency of Communication with Friends and Family: Not on file   Frequency of Social Gatherings with Friends and Family: Not on file   Attends Religious Services: Not on file   Active Member of Clubs or Organizations: Not on file  Attends Banker Meetings: Not on file   Marital Status: Not on file    No family history on file.  Past Medical History:  Diagnosis Date   Anxiety    Chronic kidney disease    removed 1 kidney   COPD (chronic obstructive pulmonary disease) (HCC)    Depression    Dyspnea    GERD (gastroesophageal reflux disease)    Hypothyroidism    Pneumonia    Sleep apnea    does not use CPAP   Suicidal behavior without attempted self-injury    Thyroid disease     Past Surgical History:  Procedure Laterality Date   CHOLECYSTECTOMY     DRUG INDUCED ENDOSCOPY N/A 12/27/2019   Procedure: DRUG INDUCED ENDOSCOPY;  Surgeon: Christia Reading, MD;  Location: [facility];  Service: ENT;  Laterality: N/A;   HERNIA REPAIR     IMPLANTATION OF HYPOGLOSSAL NERVE STIMULATOR Right 08/11/2020   Procedure: IMPLANTATION OF HYPOGLOSSAL NERVE STIMULATOR;  Surgeon: Christia Reading, MD;  Location: [facility];  Service: ENT;  Laterality: Right;  REQUESTING RNFA   NEPHRECTOMY       Current Outpatient Medications on File Prior to Visit  Medication Sig Dispense Refill   acetaminophen (TYLENOL) 500 MG tablet Take 1,000-1,500 mg by mouth every 6 (six) hours as needed for headache (pain).     atorvastatin (LIPITOR) 40 MG tablet Take 1 tablet (40 mg total) by mouth  at bedtime. (Patient taking differently: Take 20 mg by mouth at bedtime. ) 30 tablet 0   buPROPion (WELLBUTRIN XL) 150 MG 24 hr tablet Take 150 mg by mouth daily.     carbamide peroxide (DEBROX) 6.5 % OTIC solution Place 2 drops into both ears every 30 (thirty) days.     famotidine (PEPCID) 20 MG tablet Take 20 mg by mouth daily.     HYDROcodone-acetaminophen (NORCO/VICODIN) 5-325 MG tablet Take 1-2 tablets by mouth every 6 (six) hours as needed for moderate pain. 12 tablet 0   levothyroxine (SYNTHROID, LEVOTHROID) 112 MCG tablet Take 1 tablet (112 mcg total) by mouth daily before breakfast. For hypothyroidism     ondansetron (ZOFRAN-ODT) 4 MG disintegrating tablet Take 4 mg by mouth every 8 (eight) hours as needed.     Polyethyl Glycol-Propyl Glycol (LUBRICANT EYE DROPS) 0.4-0.3 % SOLN Place 1 drop into both eyes daily as needed (dry/irritated eyes.).     Pramox-PE-Glycerin-Petrolatum (HEMORRHOIDAL EX) Place 1 application rectally 4 (four) times daily as needed (hemorroids/discomfort).     psyllium (METAMUCIL) 58.6 % packet Take 1 packet by mouth at bedtime.     risperiDONE (RISPERDAL) 1 MG tablet Take 1 mg by mouth 2 (two) times daily.     No current facility-administered medications on file prior to visit.    Allergies  Allergen Reactions   Trazodone And Nefazodone Other (See Comments)    Causes muscle jerks/tremors per spouse   Latex Rash    NO POWDERED GLOVES, please!!    Physical exam:  Today's Vitals   09/13/20 0853  BP: 118/86  Pulse: 76  Weight: 192 lb (87.1 kg)  Height: 5\' 4"  (1.626 m)   Body mass index is 32.96 kg/m.   Wt Readings from Last 3 Encounters:  09/13/20 192 lb (87.1 kg)  08/11/20 194 lb 3.6 oz (88.1 kg)  08/09/20 192 lb 12.8 oz (87.5 kg)     Ht Readings from Last 3 Encounters:  09/13/20 5\' 4"  (1.626 m)  08/11/20 5\' 4"  (1.626 m)  08/09/20 5\' 4"  (1.626 m)      General: The patient is awake, alert and appears not in acute distress. The  patient is poorly groomed. Head: Normocephalic, atraumatic. Neck is supple. Mallampati 3 plus, poor dentition, extremely crowded dentition and small upper jaw, prognathia.   neck circumference:20.5 inches .  Nasal airflow barely  patent.  Cardiovascular:  Regular rate and cardiac rhythm by pulse,  without distended neck veins. Respiratory: Lungs are clear to auscultation.  Skin:  Without evidence of ankle edema, or rash. Trunk: The patient's posture is erect.   Neurologic exam : The patient is awake and alert, oriented to place and time.   Memory subjective described as intact.  Attention span & concentration ability appears normal.  Speech is fluent,  without  dysarthria, dysphonia or aphasia.  Mood and affect are appropriate.   Cranial nerves: no loss of smell or taste reported  Pupils are equal and briskly reactive to light. Funduscopic exam deferred.   Extraocular movements in vertical and horizontal planes were intact and without nystagmus. No Diplopia. Visual fields by finger perimetry are intact. Hearing was impaired  to soft voice and finger rubbing.    Facial sensation intact to fine touch. Full facial hair.   Facial motor strength is symmetric and tongue and uvula move midline.  Neck ROM : rotation, tilt and flexion extension were normal for age and shoulder shrug was symmetrical.    Motor exam:  Symmetric bulk, tone and ROM.   Normal tone without cog- wheeling, symmetric grip strength .   Sensory:  Fine touch, pinprick and vibration were normal.  Proprioception tested in the upper extremities was normal.   Coordination: Rapid alternating movements in the fingers/hands were of normal speed.  The Finger-to-nose maneuver was intact .   Gait and station: Patient could rise unassisted from a seated position, walked without assistive device.   Toe and heel walk were deferred.  Deep tendon reflexes: in the  upper and lower extremities are symmetric and intact.  Babinski  response was deferred .      After spending a total time of  36  minutes face to face and additional time for physical and neurologic examination, review of laboratory studies,  personal review of imaging studies, reports and results of other testing and review of referral information / records as far as provided in visit, I have established the following assessments:  The patient was deemed fit for an Inspire device and Dr. Jenne PaneBates implanted on 08-11-20. His scar is well healed.   1) he is going for activation tomorrow. We will titrate him in the sleep lab.   I would like to thank Si GaulGriffin, Jenny M, DO and Dr Christia Readingwight Bates, MD ENT - for allowing me to meet with and to take care of this pleasant patient.    Electronically signed by: Melvyn Novasarmen Mina Babula, MD 09/13/2020 9:12 AM  [facility] and [facility] Board certified by The [employer] of [employer] Medicine and Diplomate of the [employer] of [employer] Medicine. Board certified In Neurology through the ABPN, Fellow of the [employer] of [employer]. Medical Director of [facility].

## 2020-09-14 ENCOUNTER — Ambulatory Visit (INDEPENDENT_AMBULATORY_CARE_PROVIDER_SITE_OTHER): Payer: Medicare HMO | Admitting: Neurology

## 2020-09-14 DIAGNOSIS — Z459 Encounter for adjustment and management of unspecified implanted device: Secondary | ICD-10-CM

## 2020-09-14 DIAGNOSIS — Z789 Other specified health status: Secondary | ICD-10-CM

## 2020-09-14 DIAGNOSIS — G4733 Obstructive sleep apnea (adult) (pediatric): Secondary | ICD-10-CM

## 2020-10-03 ENCOUNTER — Other Ambulatory Visit: Payer: Self-pay | Admitting: Neurology

## 2020-10-03 DIAGNOSIS — Z789 Other specified health status: Secondary | ICD-10-CM

## 2020-10-03 DIAGNOSIS — Z459 Encounter for adjustment and management of unspecified implanted device: Secondary | ICD-10-CM

## 2020-10-12 ENCOUNTER — Telehealth: Payer: Self-pay

## 2020-10-12 NOTE — Telephone Encounter (Signed)
Called patient to check on how he is doing with Inspire. He is at level 10 and doing great. He has no tongue discomfort and is sleeping better at night. Wife states he is not moving around at night or snoring. I told him to keep up the good work and we would see him in January for his sleep study. If he needs anything to please call me.

## 2020-11-14 ENCOUNTER — Encounter: Payer: Self-pay | Admitting: Neurology

## 2020-12-18 ENCOUNTER — Ambulatory Visit (INDEPENDENT_AMBULATORY_CARE_PROVIDER_SITE_OTHER): Payer: Medicare HMO | Admitting: Neurology

## 2020-12-18 ENCOUNTER — Other Ambulatory Visit: Payer: Self-pay

## 2020-12-18 DIAGNOSIS — Z789 Other specified health status: Secondary | ICD-10-CM

## 2020-12-18 DIAGNOSIS — Q359 Cleft palate, unspecified: Secondary | ICD-10-CM

## 2020-12-18 DIAGNOSIS — J449 Chronic obstructive pulmonary disease, unspecified: Secondary | ICD-10-CM

## 2020-12-18 DIAGNOSIS — G4733 Obstructive sleep apnea (adult) (pediatric): Secondary | ICD-10-CM

## 2020-12-18 DIAGNOSIS — Z459 Encounter for adjustment and management of unspecified implanted device: Secondary | ICD-10-CM

## 2020-12-21 ENCOUNTER — Telehealth: Payer: Self-pay | Admitting: Neurology

## 2020-12-21 DIAGNOSIS — J449 Chronic obstructive pulmonary disease, unspecified: Secondary | ICD-10-CM | POA: Insufficient documentation

## 2020-12-21 DIAGNOSIS — Q359 Cleft palate, unspecified: Secondary | ICD-10-CM | POA: Insufficient documentation

## 2020-12-21 DIAGNOSIS — G4733 Obstructive sleep apnea (adult) (pediatric): Secondary | ICD-10-CM | POA: Insufficient documentation

## 2020-12-21 NOTE — Progress Notes (Signed)
DIAGNOSIS: The previously diagnosed Obstructive Sleep Apnea responded partially to INSPIRE therapy- for NREM sleep the AHI was significantly reduced under inspire therapy. At a setting of 2.2V there was a reduction to an AHI of 5.1/h.  Critical point was the consistent REM sleep apnea- not central apnea. The patient presented with REM AHI of 54.5/h at 1.9 V setting, in left lateral sleep position and with REM AHI of 45 at 2.2V setting, still in left lateral sleep position.  Total desaturation time for oxygen was still high at 54 minutes, but hypoxia was not noted as a continuous finding, not lasting more than 90 seconds at a time.  PLANS/RECOMMENDATIONS: Inspire therapy at 2.2 V will control the majority of NREM sleep stage related apneas/hypopneas. REM dependent apnea remains present.  Overall, the OSA is reduced by 50% and this placed the patient into a mild category of sleep apnea that can further improve with weight loss. His persistent hypoxemia is likely related to COPD and may require additional COPD treatment or even oxygen at night.  DIAGNOSIS: The previously diagnosed Obstructive Sleep Apnea responded partially to INSPIRE therapy- for NREM sleep the AHI was significantly reduced under inspire therapy. At a setting of 2.2V there was a reduction to an AHI of 5.1/h.  Critical point was the consistent REM sleep apnea- not central apnea. The patient presented with REM AHI of 54.5/h at 1.9 V setting, in left lateral sleep position and with REM AHI of 45 at 2.2V setting, still in left lateral sleep position.  Total desaturation time for oxygen was still high at 54 minutes, but hypoxia was not noted as a continuous finding, not lasting more than 90 seconds at a time.  PLANS/RECOMMENDATIONS: Inspire therapy at 2.2 V will control the majority of NREM sleep stage related apneas/hypopneas. REM dependent apnea remains present.  Overall, the OSA is reduced by 50% and this placed the patient into a mild  category of sleep apnea that can further improve with weight loss. His persistent hypoxemia is likely related to COPD and may require additional COPD treatment or even oxygen at night.  DIAGNOSIS: The previously diagnosed Obstructive Sleep Apnea responded partially to INSPIRE therapy- for NREM sleep the AHI was significantly reduced under inspire therapy. At a setting of 2.2V there was a reduction to an AHI of 5.1/h.  Critical point was the consistent REM sleep apnea- not central apnea. The patient presented with REM AHI of 54.5/h at 1.9 V setting, in left lateral sleep position and with REM AHI of 45 at 2.2V setting, still in left lateral sleep position.  Total desaturation time for oxygen was still high at 54 minutes, but hypoxia was not noted as a continuous finding, not lasting more than 90 seconds at a time.  PLANS/RECOMMENDATIONS: Inspire therapy at 2.2 V will control the majority of NREM sleep stage related apneas/hypopneas. REM dependent apnea remains present.  Overall, the OSA is reduced by 50% and this placed the patient into a mild category of sleep apnea that can further improve with weight loss. His persistent hypoxemia is likely related to COPD and may require additional COPD treatment or even oxygen at night.   The patient was released at the baseline 1.7 V setting and advised to increase Voltage in small increments over the next 8-12 weeks , following the Inspire-Representative's guidance . Goal setting is 2.2 V.

## 2020-12-21 NOTE — Telephone Encounter (Signed)
-----   Message from Melvyn Novas, MD sent at 12/21/2020 12:34 PM EST ----- DIAGNOSIS: The previously diagnosed Obstructive Sleep Apnea responded partially to INSPIRE therapy- for NREM sleep the AHI was significantly reduced under inspire therapy. At a setting of 2.2V there was a reduction to an AHI of 5.1/h.  Critical point was the consistent REM sleep apnea- not central apnea. The patient presented with REM AHI of 54.5/h at 1.9 V setting, in left lateral sleep position and with REM AHI of 45 at 2.2V setting, still in left lateral sleep position.  Total desaturation time for oxygen was still high at 54 minutes, but hypoxia was not noted as a continuous finding, not lasting more than 90 seconds at a time.  PLANS/RECOMMENDATIONS: Inspire therapy at 2.2 V will control the majority of NREM sleep stage related apneas/hypopneas. REM dependent apnea remains present.  Overall, the OSA is reduced by 50% and this placed the patient into a mild category of sleep apnea that can further improve with weight loss. His persistent hypoxemia is likely related to COPD and may require additional COPD treatment or even oxygen at night.  DIAGNOSIS: The previously diagnosed Obstructive Sleep Apnea responded partially to INSPIRE therapy- for NREM sleep the AHI was significantly reduced under inspire therapy. At a setting of 2.2V there was a reduction to an AHI of 5.1/h.  Critical point was the consistent REM sleep apnea- not central apnea. The patient presented with REM AHI of 54.5/h at 1.9 V setting, in left lateral sleep position and with REM AHI of 45 at 2.2V setting, still in left lateral sleep position.  Total desaturation time for oxygen was still high at 54 minutes, but hypoxia was not noted as a continuous finding, not lasting more than 90 seconds at a time.  PLANS/RECOMMENDATIONS: Inspire therapy at 2.2 V will control the majority of NREM sleep stage related apneas/hypopneas. REM dependent apnea remains present.   Overall, the OSA is reduced by 50% and this placed the patient into a mild category of sleep apnea that can further improve with weight loss. His persistent hypoxemia is likely related to COPD and may require additional COPD treatment or even oxygen at night.  DIAGNOSIS: The previously diagnosed Obstructive Sleep Apnea responded partially to INSPIRE therapy- for NREM sleep the AHI was significantly reduced under inspire therapy. At a setting of 2.2V there was a reduction to an AHI of 5.1/h.  Critical point was the consistent REM sleep apnea- not central apnea. The patient presented with REM AHI of 54.5/h at 1.9 V setting, in left lateral sleep position and with REM AHI of 45 at 2.2V setting, still in left lateral sleep position.  Total desaturation time for oxygen was still high at 54 minutes, but hypoxia was not noted as a continuous finding, not lasting more than 90 seconds at a time.  PLANS/RECOMMENDATIONS: Inspire therapy at 2.2 V will control the majority of NREM sleep stage related apneas/hypopneas. REM dependent apnea remains present.  Overall, the OSA is reduced by 50% and this placed the patient into a mild category of sleep apnea that can further improve with weight loss. His persistent hypoxemia is likely related to COPD and may require additional COPD treatment or even oxygen at night.   The patient was released at the baseline 1.7 V setting and advised to increase Voltage in small increments over the next 8-12 weeks , following the Inspire-Representative's guidance . Goal setting is 2.2 V.

## 2020-12-21 NOTE — Procedures (Signed)
PATIENT'S NAME:  Lannie, Yusuf DOB:      Aug 06, 1961      MR#:    [id number]     DATE OF RECORDING: 12/18/2020  B. Evonnie Dawes M.D.:  Sammuel Hines, MD Study Performed:   Inspire Titration HISTORY:  COLLEEN DONAHOE is a 60 y.o. year old Caucasian male patient seen on 09/13/2020 from Dr Jenne Pane, MD, ENT, specifically for an Inspire therapy related consultation.  Chief concern according to patient:   Mr. Jacqulyn Ducking reports that he is a patient suffering from obstructive sleep apnea and that he underwent a sleep study many, many years ago.  He could not remember at what date but assured me that this was over a decade ago since he was tested for apnea.  Apparently, he was not able to tolerate CPAP after the test confirmed OSA- attributed to his cleft palate and to psychological factors.  He was now evaluated by ENT for the inspire device implant.    He has excessive daytime sleepiness and has hypersomnia, sleeping 10 to 12 hours a day.  In June 2021 he was hospitalized for pneumonia.  He still has been coughing intermittently and it also interrupts his sleep as to nocturia.  To my surprise I found a reports of a recent sleep study in his [facility] records: Result of an AHI of 25/h without significant central events.  The sleep study was not done in our lab. It is listed here that he actually had a sleep study Just in 2021- which is not what he recalled. The sleep study did not rule out significant REM sleep dependent apnea. The colleagues did not state that there were barriers to benefit from inspire therapy.    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    The patient endorsed the Epworth Sleepiness Scale at 21 points.   The patient's weight 194 pounds with a height of 64 (inches), resulting in a BMI of 33.1 kg/m2. The patient's neck circumference measured 20.5 inches.  CURRENT MEDICATIONS: Tylenol, Albuterol, Lipitor, Zithromax, Wellbutrin, Pepcid, Synthroid, Risperdal  PROCEDURE:  This is a multichannel digital polysomnogram utilizing the SomnoStar 11.2 system.  Electrodes and sensors were applied and monitored per AASM Specifications.   EEG, EOG, Chin and Limb EMG, were sampled at 200 Hz.  ECG, Snore and Nasal Pressure, Thermal Airflow, Respiratory Effort, CPAP Flow and Pressure, Oximetry was sampled at 50 Hz. Digital video and audio were recorded.      Earnest Bailey was initiated at a setting of 1.7 V and was advanced to 2.2V because of hypopneas, apneas and desaturations.   At a final setting of 2.2V there was a reduction to AHI of 5.1/h but sleep was also highly fragmented.  Critical point was the consistent REM sleep apnea- not central apnea. The patient presented with REM AHI of 54.5/h at 1.9 V setting, in left lateral sleep position and with REM AHI of 45 at 2.2V setting, still in left lateral sleep position.   Lights Out was at 00:04 and Lights On at 07:05 AM. Total recording time (TRT) was 357.5 minutes,  with a total sleep time (TST) of 321.5 minutes. The patient's sleep latency was 2 minutes. REM latency was 92.5 minutes.  The sleep efficiency was 89.9 %.    SLEEP ARCHITECTURE: WASO (Wake after sleep onset) was 44 minutes.  There were 57 minutes in Stage N1, 175 minutes Stage N2, 58 minutes Stage N3 and 46.5 minutes in Stage REM.  The percentage of Stage N1 was 16.9%, Stage N2 was 52.%, Stage N3 was 17.2% and Stage R (REM sleep) was 13.8  RESPIRATORY  ANALYSIS:  There was a total of 70 respiratory events: 0 obstructive apneas, 0 central apneas and 70 hypopneas.  The total APNEA/HYPOPNEA INDEX (AHI) was 13.1 /hour. 35 events occurred in REM sleep and 35 events in NREM. The REM AHI was still high at 45.2 /hour versus a non-REM AHI of 7.6 /hour. The patient spent 133.5 minutes of total sleep time in the supine position and 203 minutes in non-supine. The supine AHI was 5.9/h, versus a non-supine AHI of 18.0/h. All REM sleep was recorded while in non-supine sleep position.  OXYGEN SATURATION & C02:  The baseline 02 saturation was 88%, with the lowest being 82%. Time spent below 89% saturation equaled 42 minutes. The arousals were noted as: 93 were spontaneous, 0 were associated with PLMs, 30 were associated with respiratory events. The patient had a total of 0 Periodic Limb Movements.  Audio and video analysis did not show any abnormal or unusual movements, behaviors, phonations or vocalizations. Mild snoring was noted throughout the study - not fully controlled by inspire. EKG was in keeping with normal sinus rhythm (NSR).  DIAGNOSIS: The previously diagnosed Obstructive Sleep Apnea responded partially to INSPIRE therapy- for NREM sleep the AHI was significantly reduced under inspire therapy. At a setting of 2.2V there was a reduction to an AHI of 5.1/h.  Critical point was the consistent REM sleep apnea- not central apnea. The patient presented with REM AHI of 54.5/h at 1.9 V setting, in left lateral sleep position and with REM AHI of 45 at 2.2V setting, still in left lateral sleep position.  Also total desaturation time for oxygen was still high at 54 minutes, but hypoxia was not noted as a continuous finding, not lasting more than 90 seconds at a time.  PLANS/RECOMMENDATIONS: Inspire therapy at 2.2 V will control the majority of NREM sleep stage related apneas/hypopneas. REM dependent apnea remains present.  Overall, the OSA is reduced by 50% and  this placed the patient into a mild category of sleep apnea that can further improve with weight loss. His persistent hypoxemia is likely related to COPD and may require additional COPD treatment or even oxygen at night.   DISCUSSION: A follow up appointment will be scheduled in the Sleep Clinic at [facility].     I certify that I have reviewed the entire raw data recording prior to the issuance of this report in accordance with the Standards of Accreditation of the American Academy of Sleep Medicine (AASM) Melvyn Novas, M.D. Diplomat, Biomedical engineer of Psychiatry and Neurology  Diplomat, Biomedical engineer of Sleep Medicine Wellsite geologist, [employer] [facility] [employer]

## 2020-12-21 NOTE — Telephone Encounter (Signed)
Called the patient and spoke with him and his wife. Advised the 2.2V was able to reduce the overall apnea events to 5.1. The REM stage AHI events still remains his high but that is to be expected. Advised the setting was put back to where he started at,1.7V and that at the upcoming apt 2/3 the inspire rep and Dr Dohmeier will make adjustments to increase at that time. We will then at that time advise the next steps. Pt verbalized understanding. Pt had no questions at this time but was encouraged to call back if questions arise.

## 2021-01-18 ENCOUNTER — Ambulatory Visit: Payer: Medicare HMO | Admitting: Neurology

## 2021-01-18 ENCOUNTER — Encounter: Payer: Self-pay | Admitting: Neurology

## 2021-01-18 VITALS — BP 125/85 | HR 96

## 2021-01-18 DIAGNOSIS — Z459 Encounter for adjustment and management of unspecified implanted device: Secondary | ICD-10-CM

## 2021-01-18 DIAGNOSIS — G4733 Obstructive sleep apnea (adult) (pediatric): Secondary | ICD-10-CM | POA: Diagnosis not present

## 2021-01-18 DIAGNOSIS — R0902 Hypoxemia: Secondary | ICD-10-CM | POA: Insufficient documentation

## 2021-01-18 DIAGNOSIS — Q359 Cleft palate, unspecified: Secondary | ICD-10-CM | POA: Diagnosis not present

## 2021-01-18 DIAGNOSIS — Z789 Other specified health status: Secondary | ICD-10-CM

## 2021-01-18 DIAGNOSIS — F332 Major depressive disorder, recurrent severe without psychotic features: Secondary | ICD-10-CM

## 2021-01-18 DIAGNOSIS — G4710 Hypersomnia, unspecified: Secondary | ICD-10-CM | POA: Insufficient documentation

## 2021-01-18 NOTE — Progress Notes (Signed)
SLEEP MEDICINE CLINIC    Provider:  Melvyn Novas, MD  Primary Care Physician:  Si Gaul, DO [address] [place] [address]     I heard about inspire on TV  Referring Provider: Dr Jenne Pane, MD         Chief Complaint according to patient   Patient presents with:    . New Patient (Initial Visit)           HISTORY OF PRESENT ILLNESS:  Devin Joseph is a 60 y.o. year old  Caucasian male patient seen on 01/18/2021 from Dr Jenne Pane, MD, ENT> after [facility] implanted 08-12-2020. Today is 18 January 2021 and I am meeting with Devin Joseph and his wife, he is here for further titration on his inspire device.  The patient remains excessively daytime sleepy on his current settings of 1.9 V.  It seems that he sleeps between 12 and 15 hours a day according to his wife.  The patient has significant hearing loss which makes our communication a little bit more difficult.  He quit over 5 years ago.  His download of therapeutic data for the last 30 days shows that he has used the device every night 100% and on average for 11.3 hours.  He does not pause the therapy at night for example for bathroom break or etc.  His wife has sometimes restarted the device at about 8 AM when she gets up in the morning.  0.7.  The amplitude at 100% of the time was 1.9 V.  The incoming patient control allows for an increase to up to 2.7 V.  His preimplant visit with US showed 25 AHI before therapy and during titration on 18 December 2020 an AHI of 4 the therapeutic level that we found was 2.2 V so he is still at this time below the recommended level.  I would like to add that his 1.9 V setting at which she is currently revealed during the sleep study the possibility for an REM AHI of 54.5/h so this was not at all enough to control his apnea.  The patient also had hypoxemia at night which may not be controlled under inspire at all.  So today we will and prolong the therapy duration from 8 to 10 hours we will  set the upper limits of his voltage today for stimulation to 2.2 be set his goal to level 5 and configure rated today the patient and able to control to be between 1.8 fold and 2.5 V.  Start delay will remain at 13-minute even more if he wanted overstimulation amplitude will be set today to 2.0 up from  1.9 Volt.   I would like to see if the patient responds with a better level of alertness after increasing dose settings current fatigue severity score was endorsed at 53 points which is very high and the Epworth sleepiness score is still at 16 points also very high we also discussed sleep hygiene and the patient has a rather continued time for at nighttime bedtime but he does not have a set time to rise in the morning I would like him to stay no longer in bed than 8 AM and to avoid all daytime naps.  In addition I will order a home sleep test while on the inspire device in the next 4 to 6 weeks to see if he is hypoxemic #2 what the residual apnea count may be I would like to add that this  patient was not pretested for the needs of the inspire device in our office setting   . CONSULTATION _  Chief concern according to patient :   Devin Joseph reports that he is a patient suffering from obstructive sleep apnea he underwent a sleep study many many years ago.  He could not remember at what date -but over a decade ago he was tested for apnea.  Apparently he was not able to tolerate CPAP.  He was then evaluated by ENT for the inspire device implant.  He needs to have to undergo another repeat sleep study to rule out significant central apneas.  This will detect if he has any barriers to benefit from inspire therapy.  He has excessive daytime sleepiness but he is also hypersomniac sleeping 10 to 12 hours a day.  In June 2021 he was hospitalized for pneumonia.  He still has coughing intermittent and it also interrupts his sleep as to nocturia.  I found history of a sleep study "it was a result of an AHI of 25 without  significant central events.  The sleep study was not done in our lab.  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  It is listed here that he actually had a sleep study just in 2020 which is not what he told me.  I reviewed his medications as well.  Risperidone, famotidine-Pepcid Synthroid, Wellbutrin, Zithromax, Lipitor Tylenol as needed.   I have the pleasure of seeing Devin Joseph today, a right -handed  Caucasian male , who has a past medical history of GAD/ Anxiety, Chronic kidney disease, COPD (chronic obstructive pulmonary disease) (HCC), Depression, Dyspnea, GERD (gastroesophageal reflux disease), Hypothyroidism, Pneumonia, Sleep apnea, Suicidal behavior without attempted self-injury, and Thyroid disease..   and ongoing exposure to cigarette smoke - living with mother 72(78), a smoker. He is a former smoker-  The patient had the first sleep study in the year 2005(?)- it was at  [facility].  He was prescribed a CPAP but couldn't use it- it caused dryness in mouth and nose. Sleep relevant medical history: Nocturia times 2-3 , no Tonsillectomy,he is chronically suffering from nasal congestion.   Family medical /sleep history: no other family member on CPAP with OSA, insomnia, sleep walkers.    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 HS graduate.  and lives in a household with wife and mother.   Pets are present.a dog and a cat.  Tobacco use- former and ongoing passive exposure .   ETOH use ; rare, sober for 4 years,  Caffeine intake in form of Coffee( 2 cup) Soda( sometimes) Tea ( sometimes) or energy drinks. Regular exercise -none      Sleep habits are as follows: The patient's dinner time is between 6.30 PM. The patient goes to bed at 10.30 PM and continues to sleep in intervals of 2-4 hours, wakes for 2-3 bathroom breaks.   The preferred sleep position is on his right side , with the support of 3 pillows.he needs to elevate his head chest.  Dreams are reportedly rare.  8 AM is the usual rise  time. The patient wakes up spontaneously at 7 AM .  He reports not commonly feeling refreshed or restored in AM, with symptoms such as dry mouth, coughing , morning headaches, and residual fatigue.  Naps are taken frequently, lasting from 4-6 hours !!!   Total sleep time in 24 hours would be more than 11 hours ! .    Review of Systems: Out of a complete 14 system review, the patient complains of only the following symptoms, and all other reviewed systems are negative.:  Fatigue, sleepiness , snoring, fragmented sleep, nocturia, coughing spells, rhinitis. Snorer.    How likely are you to doze in the following situations: 0 = not likely, 1 = slight chance, 2 = moderate chance, 3 = high chance   Sitting and Reading? Watching Television? Sitting inactive in a public place (theater or meeting)? As a passenger in a car for an hour without a break? Lying down in the afternoon when circumstances permit? Sitting and talking to someone? Sitting quietly after lunch without alcohol? In a car, while stopped for a few minutes in traffic?   Total = 18 from 21/ 24 points   FSS endorsed at 53/ 63 points. High degree of fatigue, high degree of anxiety and depression.    Social History   Socioeconomic History  . Marital status: Married    Spouse name: Not on file  . Number of children: Not on file  . Years of education: Not on file  . Highest education level: Not on file   Occupational History  . Not on file  Tobacco Use  . Smoking status: Current Some Day Smoker    Types: Cigarettes  . Smokeless tobacco: Never Used  Vaping Use  . Vaping Use: Never used  Substance and Sexual Activity  . Alcohol use: Yes    Comment: occ  . Drug use: No  . Sexual activity: Yes    Birth control/protection: None  Other Topics Concern  . Not on file  Social History Narrative  . Not on file   Social Determinants of Health   Financial Resource Strain: Not on file  Food Insecurity: Not on file  Transportation Needs: Not on file  Physical Activity: Not on file  Stress: Not on file  Social Connections: Not on file    No family history on file.  Past Medical History:  Diagnosis Date  . Anxiety   . Chronic kidney disease    removed 1 kidney  . COPD (chronic obstructive pulmonary disease) (HCC)   . Depression   . Dyspnea   . GERD (gastroesophageal reflux disease)   . Hypothyroidism   . Pneumonia   . Sleep apnea    does not use CPAP  . Suicidal behavior without attempted self-injury   . Thyroid disease     Past Surgical History:  Procedure Laterality Date  . CHOLECYSTECTOMY    . DRUG INDUCED ENDOSCOPY N/A 12/27/2019   Procedure: DRUG INDUCED ENDOSCOPY;  Surgeon: Christia Reading, MD;  Location: [facility];  Service: ENT;  Laterality: N/A;  . HERNIA REPAIR    . IMPLANTATION OF HYPOGLOSSAL NERVE STIMULATOR Right 08/11/2020   Procedure: IMPLANTATION OF HYPOGLOSSAL NERVE STIMULATOR;  Surgeon: Christia Reading, MD;  Location: [facility];  Service: ENT;  Laterality: Right;  REQUESTING RNFA  . NEPHRECTOMY       Current Outpatient Medications on File Prior to Visit  Medication Sig Dispense Refill  . acetaminophen (TYLENOL) 500 MG tablet Take 1,000-1,500 mg by mouth  every 6 (six) hours as needed for headache (pain).    Marland Kitchen atorvastatin (LIPITOR) 40 MG tablet Take 1 tablet (40 mg total) by mouth at bedtime. (Patient taking differently: Take  20 mg by mouth at bedtime.) 30 tablet 0  . carbamide peroxide (DEBROX) 6.5 % OTIC solution Place 2 drops into both ears every 30 (thirty) days.    . famotidine (PEPCID) 20 MG tablet Take 20 mg by mouth daily.    Marland Kitchen levothyroxine (SYNTHROID, LEVOTHROID) 112 MCG tablet Take 1 tablet (112 mcg total) by mouth daily before breakfast. For hypothyroidism    . ondansetron (ZOFRAN-ODT) 4 MG disintegrating tablet Take 4 mg by mouth every 8 (eight) hours as needed.    Bertram Gala Glycol-Propyl Glycol (LUBRICANT EYE DROPS) 0.4-0.3 % SOLN Place 1 drop into both eyes daily as needed (dry/irritated eyes.).    Marland Kitchen Pramox-PE-Glycerin-Petrolatum (HEMORRHOIDAL EX) Place 1 application rectally 4 (four) times daily as needed (hemorroids/discomfort).    . psyllium (METAMUCIL) 58.6 % packet Take 1 packet by mouth at bedtime.    . risperiDONE (RISPERDAL) 1 MG tablet Take 1 mg by mouth daily.     No current facility-administered medications on file prior to visit.    Allergies  Allergen Reactions  . Trazodone And Nefazodone Other (See Comments)    Causes muscle jerks/tremors per spouse  . Latex Rash    NO POWDERED GLOVES, please!!    Physical exam:  Today's Vitals   01/18/21 0932  BP: 125/85  Pulse: 96   There is no height or weight on file to calculate BMI.   Wt Readings from Last 3 Encounters:  09/13/20 192 lb (87.1 kg)  08/11/20 194 lb 3.6 oz (88.1 kg)  08/09/20 192 lb 12.8 oz (87.5 kg)     Ht Readings from Last 3 Encounters:  09/13/20 5\' 4"  (1.626 m)  08/11/20 5\' 4"  (1.626 m)  08/09/20 5\' 4"  (1.626 m)     The patient was deemed fit for an Inspire device and Dr. implanted on 08-11-20. His scar is well healed.   General: The patient is awake, alert and appears not in acute distress. The patient is poorly groomed. Head: Normocephalic, atraumatic. Neck is supple. Mallampati 3 plus, poor dentition, extremely crowded dentition and small upper jaw, prognathia.   neck circumference:20.5 inches .   Nasal airflow barely  patent.  Cardiovascular:  Regular rate and cardiac rhythm by pulse,  without distended neck veins. Respiratory: Lungs are clear to auscultation. Audible breathing.  Skin:  Without evidence of ankle edema, or rash. Trunk: The patient's posture is stooped. .   Neurologic exam :   Cranial nerves: no loss of smell or taste reported  Pupils are equal and briskly reactive to light. Funduscopic exam deferred.   Extraocular movements in vertical and horizontal planes were intact and without nystagmus. No Diplopia. Visual fields by finger perimetry are intact. Hearing was impaired  to soft voice and finger rubbing.    Facial sensation intact to fine touch. Full facial hair.   Facial motor strength is symmetric and tongue and uvula move midline.  Neck ROM : rotation, tilt and flexion extension were normal for age and shoulder shrug was symmetrical.    Motor exam:  Symmetric bulk, tone and ROM.   Normal tone without cog- wheeling, symmetric grip strength .   Sensory:  Fine touch, pinprick and vibration were normal.  Proprioception tested in the upper extremities was normal Coordination: Rapid alternating movements in the fingers/hands were of normal  speed.  The Finger-to-nose maneuver was intact  Gait and station: Patient could rise unassisted from a seated position, walked without assistive device. Deep tendon reflexes: in the  upper and lower extremities are symmetric and intact.  Babinski response was deferred .      After spending a total time of 25 minutes face to face and additional time for physical and neurologic examination, review of laboratory studies,  personal review of imaging studies, reports and results of other testing and review of referral information / records as far as provided in visit, I have established the following assessments:  Will be adjusted today to settings on the inspire therapy and increase in the incoming voltage from 1.9-2.0 which brings  him a little closer to the goal of 2.2 V as discovered during his sleep study on 3 January.  We will also wait until he reaches that goal before we order a home sleep test to follow-up.  We need to make sure that he does not have the same hypoxemia that was revealed in his titration study, he also had a very strong REM dependent sleep apnea which may not respond.  This may explain some of the fatigue and persistent sleepiness.  I think he is truly not at goal and we will slowly as comfortable as possibly approach the higher settings we also increase the therapy time from 8 to 10hours we discussed sleep hygiene changes, and a revisit can be done in 3 months when he should have reached the 2.2 V right therapeutic setting stage V.    Electronically signed by: Melvyn Novas, MD 01/18/2021 10:04 AM  [facility] and [facility] Sleep Board certified by The [employer] of Sleep Medicine and Diplomate of the [employer] of Sleep Medicine. Board certified In Neurology through the ABPN, Fellow of the [employer] of Neurology. Medical Director of [employer].

## 2021-01-18 NOTE — Patient Instructions (Signed)
Inspire goal setting is 2.2 V.

## 2021-01-22 ENCOUNTER — Telehealth: Payer: Self-pay

## 2021-01-22 NOTE — Telephone Encounter (Signed)
LVM for pt to call me back to schedule sleep study  

## 2021-01-23 ENCOUNTER — Encounter: Payer: Self-pay | Admitting: Neurology

## 2021-03-10 IMAGING — CR DG CHEST 1V
1 series · 1 of 1 positions shown · non-contrast
Comparison: Postoperative neck radiographs today. Chest radiograph
06/02/2020 and earlier.

CLINICAL DATA: 58-year-old male status post implantation of
hypoglossal nerve stimulator.

EXAM:
CHEST  1 VIEW

[chest ap]
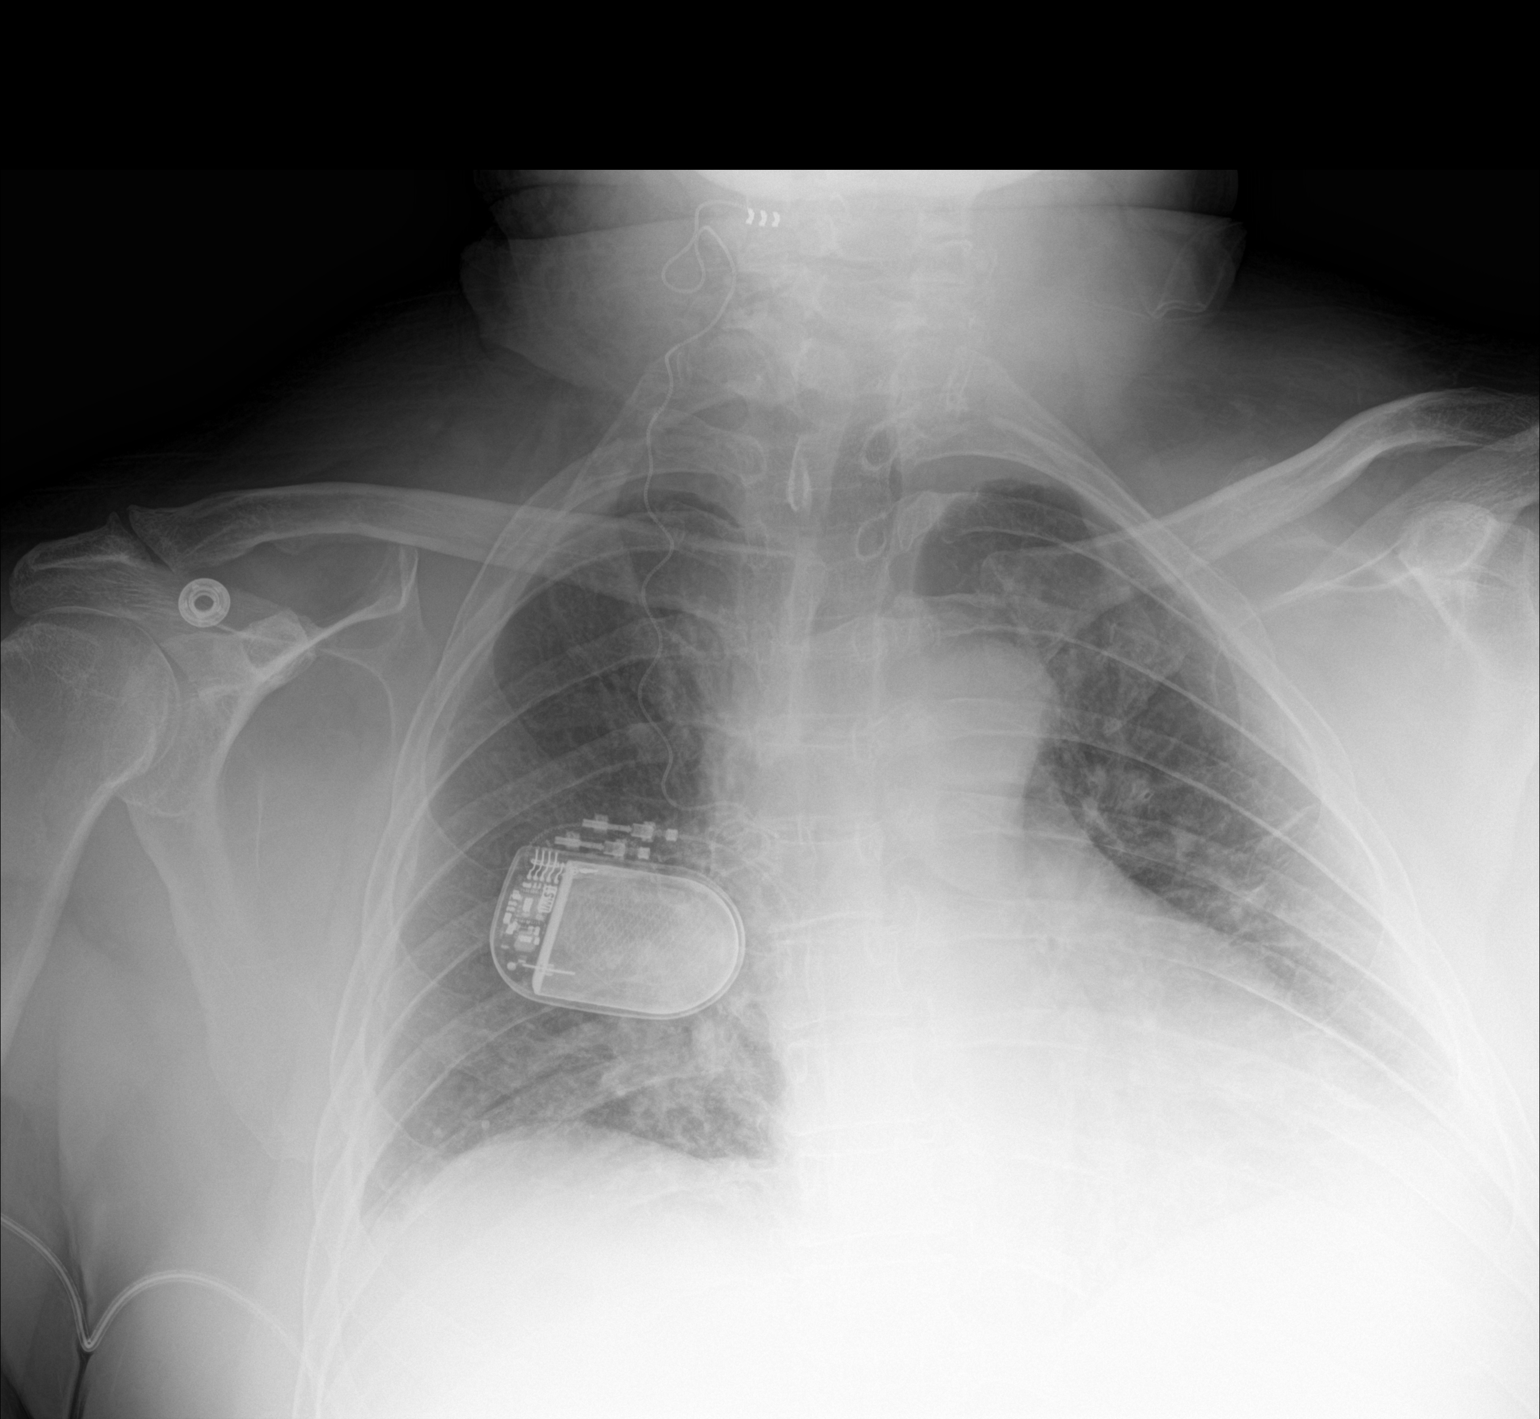

[1 of 1 positions shown; findings below may reference images not displayed]

FINDINGS: Portable AP semi upright view at 5660 hours. Right chest generator
device with a single electrical lead coursing to the right
sublingual space, see comparison. Lower lung volumes, accentuating
the cardiac and mediastinal contours. The patient is mildly rotated
to the left. Visualized tracheal air column is within normal limits.
Allowing for portable technique the lungs are clear. No acute
osseous abnormality identified.
IMPRESSION: 1. Low lung volumes, otherwise no acute cardiopulmonary abnormality.
2. Right chest generator with stimulator lead to the right
sublingual space.

## 2021-03-15 ENCOUNTER — Telehealth: Payer: Self-pay

## 2021-03-15 NOTE — Telephone Encounter (Signed)
LVM for pt to call me back to schedule sleep study  

## 2021-03-20 ENCOUNTER — Telehealth: Payer: Self-pay

## 2021-03-20 NOTE — Telephone Encounter (Signed)
Pt talked with sleep lab manager, Zella Ball H, this afternoon. Pt is still titrating the voltage of his Inspire device. Patient is not ready to do HST at this time. He wants to have his follow up visit in May with Dr. Vickey Huger first. Also during this visit, he would like to get the device remote downloaded and have the Dr. review it with him at that time. At that point, he would be willing to do HST, if MD is ready for that to happen.

## 2021-04-09 ENCOUNTER — Telehealth: Payer: Self-pay | Admitting: Neurology

## 2021-04-09 NOTE — Telephone Encounter (Signed)
Pt's wife called needing to speak to RN regarding the pt's Inspire. Pt is having issues with it and is stating that it keeps waking him up. Please advise.

## 2021-04-09 NOTE — Telephone Encounter (Signed)
Called the patient's wife back.  She states that sometimes the patient is able to go to sleep with no problem with the device.  Some nights he is a little restless an hour later he says he cannot sleep.  They try to reset it and sometimes he ends up being fine and other times he wakes back up.  She believes it is the way it is moving his tongue that is waking him up.  She stated the level is currently at 6 of 10 "dots". He has complained about both level 4 and 6 that she has tried to set it at. There are no numbers there.  I reviewed the note and it states his goal voltage is 2.2 V. I let her know I would d/w Dr Vickey Huger and call back. Next appt currently scheduled for 05/09/21.

## 2021-04-09 NOTE — Telephone Encounter (Signed)
Spoke with Dr. Vickey Huger.  She suggested wife dial device back to a level 5 or lower to see where he is comfortable.  We can also see about moving his appointment up sooner with [facility] or Dr. Vickey Huger.

## 2021-04-09 NOTE — Telephone Encounter (Signed)
Spoke with the patient's wife.  She stated he was completely fine until she moved the level up so she is going to go back down tonight and see if he tolerates that better.  If he does not she will call and we will get an appointment scheduled for as soon as possible.  She is aware Dr. Vickey Huger is getting ready to be out of the office for a couple weeks but Aundra Millet NP would be able to see him pending availability.  She verbalized appreciation for the call.

## 2021-04-25 ENCOUNTER — Ambulatory Visit: Payer: Medicare HMO | Admitting: Neurology

## 2021-05-09 ENCOUNTER — Encounter: Payer: Self-pay | Admitting: Neurology

## 2021-05-09 ENCOUNTER — Ambulatory Visit: Payer: Medicare HMO | Admitting: Neurology

## 2021-05-09 VITALS — BP 112/80 | HR 73 | Ht 64.0 in | Wt 195.0 lb

## 2021-05-09 DIAGNOSIS — G4710 Hypersomnia, unspecified: Secondary | ICD-10-CM

## 2021-05-09 DIAGNOSIS — G4733 Obstructive sleep apnea (adult) (pediatric): Secondary | ICD-10-CM

## 2021-05-09 DIAGNOSIS — Q359 Cleft palate, unspecified: Secondary | ICD-10-CM

## 2021-05-09 DIAGNOSIS — Z459 Encounter for adjustment and management of unspecified implanted device: Secondary | ICD-10-CM

## 2021-05-09 NOTE — Progress Notes (Signed)
Epworth Sleepiness Scale 0= would never doze 1= slight chance of dozing 2= moderate chance of dozing 3= high chance of dozing  Sitting and reading:1 Watching TV: 1 Sitting inactive in a public place (ex. Theater or meeting): 1 As a passenger in a car for an hour without a break: 1 Lying down to rest in the afternoon: 0 Sitting and talking to someone: 0 Sitting quietly after lunch (no alcohol): 1 In a car, while stopped in traffic: 0 Total: 5  FSS 9

## 2021-05-09 NOTE — Patient Instructions (Signed)
I was able to interrogate the device and reset the therapy from 2.1 V to an amplitude of 1.8 V today-  the patient control will range from 1.6 through 2.3 V.  This is a reduction by 3 steps from his last setting.  The start delay, pulse time and therapy duration will remain the same.  Stimulation is plus minus plus with a pulse width of 90 rate of 33 and a maximum stimulation time of 4.  Sensation voltage was documented as beginning at 0.9 but he originally started so he is already at a higher rate today.  I also asked him to be encouraged to reduce the setting should he have discomfort at night again.   I He will be called by phone in 14 days to see if he is ready to resume upward titration.

## 2021-05-09 NOTE — Progress Notes (Signed)
SLEEP MEDICINE CLINIC    Provider:  Melvyn Novas, MD  Primary Care Physician:  Melvenia Beam, MD [address]  [address] [place] [address]     I heard about inspire on TV  Referring Provider: Dr Jenne Pane, MD         Chief Complaint according to patient   Patient presents with:    . New Patient (Initial Visit)     RV of inspire patient - with hypoxia and mixed apnea,       HISTORY OF PRESENT ILLNESS:  Devin Joseph is a 60 y.o. year old  Caucasian male patient seen on 05/09/2021 from Dr Jenne Pane, MD, ENT> after [facility] implanted 08-12-2020. The patient was not a [facility] patient until after implantation. . Last visit was on 18 January 2021 - he had been doing well with inspire device in February and March - than he approached level 7 and the device caused him wake up at night. He returned to level 4 and still feels bothered.  The patient's wife has noticed that his tongue approaches the lower teeth line, but the patient feels as if his tongue is just buckling instead of protruding. He does not feel that the tongue deviates to either side.  He describes the sensation that wakes him as a discomfort not truly of pain, but he has been using the device now very irregularly over the last 4 weeks or so. Hypersomnia in a cyclic fashion is present, and he has been known to sleep sometimes all day for 3-5 days in a row. He never has lees than 5 hours of sleep.  The patient's setting on February 3 was quoted below initially at 1.9 amplitude voltage he was increased to 2.0 the patient's own control ranges now between 1.8 and 2.4 V start delay is 30 minutes pulse is 50 minutes and therapy duration was set at 10 hours overnight.  Battery is fully charged usage per hour was 387 hours over the last and only 70 over the last week.  I was able to interrogate the device and reset the therapy from 2.1 V to an amplitude of 1.8 V today the patient control will range from 1.6 through 2.3 V.   This is a reduction by 3 steps from his last setting.  The start delay, pulse time and therapy duration will remain the same.  Stimulation is plus minus plus with a pulse width of 90 rate of 33 and a maximum stimulation time of 4.  Sensation voltage was documented as beginning at 0.9 but he originally started so he is already at a higher rate today.  I also asked him to be encouraged to reduce the setting should he have discomfort at night again.    01-18-2021/  I am meeting with Devin Joseph and his wife, he is here for further titration on his inspire device.  The patient remains excessively daytime sleepy on his current settings of 1.9 V.  It seems that he sleeps between 12 and 15 hours a day according to his wife.  The patient has significant hearing loss which makes our communication a little bit more difficult.  He quit using CPAP over 5 years ago. He has still been in need of oxygen . He continued to use tobacco.      He does not pause the therapy at night for example for bathroom break or etc.  His wife has sometimes restarted the device at about 8 AM  when she gets up in the morning at  0.7Volts.   The amplitude at 100% of the time was 1.9 V.  The incoming patient control allows for an increase to up to 2.7 V.  His preimplant visit with us showed 25 AHI before therapy and during titration on 18 December 2020 an AHI of 4 the therapeutic level that we found was 2.2 V so he is still at this time below the recommended level.  I would like to add that his 1.9 V setting at which she is currently revealed during the sleep study the possibility for an REM AHI of 54.5/h so this was not at all enough to control his apnea.  The patient also had hypoxemia at night which may not be controlled under inspire at all.  So today we will and prolong the therapy duration from 8 to 10 hours we will set the upper limits of his voltage today for stimulation to 2.2 be set his goal to level 5 and configure rated today the patient  and able to control to be between 1.8 fold and 2.5 V.  Start delay will remain at 13-minute even more if he wanted overstimulation amplitude will be set today to 2.0 up from  1.9 Volt.   I would like to see if the patient responds with a better level of alertness after increasing dose settings current fatigue severity score was endorsed at 53 points which is very high and the Epworth sleepiness score is still at 16 points-  Will adjusted today to settings on the inspire therapy and increase in the incoming voltage from 1.9-2.0 which brings him a little closer to the goal of 2.2 V as discovered during his sleep study on 3 January.  We will also wait until he reaches that goal before we order a home sleep test to follow-up.  We need to make sure that he does not have the same hypoxemia that was revealed in his titration study, he also had a very strong REM dependent sleep apnea which may not respond.  This may explain some of the fatigue and persistent sleepiness.  I think he is truly not at goal and we will slowly as comfortable as possibly approach the higher settings we also increase the therapy time from 8 to 10hours we discussed sleep hygiene changes, and a revisit can be done in 3 months when he should have reached the 2.2 V right therapeutic setting stage V.   . CONSULTATION _  Chief concern according to patient :   Devin Joseph reports that he is a patient suffering from obstructive sleep apnea he underwent a sleep study many many years ago.  He could not remember at what date -but over a decade ago he was tested for apnea.  Apparently he was not able to tolerate CPAP.  He was then evaluated by ENT for the inspire device implant.  He needs to have to undergo another repeat sleep study to rule out significant central apneas.  This will detect if he has any barriers to benefit from inspire therapy.  He has excessive daytime sleepiness but he is also hypersomniac sleeping 10 to 12 hours a day.  In June  2021 he was hospitalized for pneumonia.  He still has coughing intermittent and it also interrupts his sleep as to nocturia.  I found history of a sleep study "it was a result of an AHI of 25 without significant central events.  The sleep study was not done in our  lab.  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  It is listed here that he actually had a sleep study just in 2020 which is not what he told me.  I reviewed his medications as well.  Risperidone, famotidine-Pepcid Synthroid, Wellbutrin, Zithromax, Lipitor Tylenol as needed.   I have the pleasure of seeing Devin Joseph today, a right -handed  Caucasian male , who has a past medical history of GAD/ Anxiety, Chronic kidney disease, COPD (chronic obstructive pulmonary disease) (HCC), Depression, Dyspnea, GERD (gastroesophageal reflux disease), Hypothyroidism, Pneumonia, Sleep apnea, Suicidal behavior without attempted self-injury, and Thyroid disease..   and ongoing exposure to cigarette smoke - living with mother (31), a smoker. He is a former smoker-  The patient had the first sleep study in the year 2005(?)- it was at  [facility].  He was prescribed a CPAP but couldn't use it- it caused dryness in mouth and nose. Sleep relevant medical history: Nocturia times 2-3 , no Tonsillectomy,he is chronically suffering from nasal congestion.   Family medical /sleep history: no other family member on CPAP with OSA, insomnia, sleep walkers.    Social history:  Patient is disabled -  Corporate investment banker, Buyer, retail. HS graduate.  and lives in a household with wife and mother.   Pets are present.a dog and a cat.  Tobacco use- former and ongoing passive exposure .  ETOH use ; rare, sober for 4 years,  Caffeine intake in form of Coffee( 2 cup) Soda( sometimes) Tea ( sometimes) or energy drinks. Regular exercise -none      Sleep habits are as follows: The patient's dinner time is between 6.30 PM. The patient goes to bed at 10.30 PM and continues to sleep in intervals of 2-4 hours, wakes for 2-3 bathroom breaks.   The preferred sleep position is on his right side , with the support of 3 pillows.he needs to elevate his head chest.  Dreams are reportedly rare.  8 AM is the usual rise time. The patient wakes up spontaneously at 7 AM .  He reports not commonly feeling refreshed or restored in AM, with symptoms such as dry mouth, coughing , morning headaches, and residual fatigue.  Naps are taken frequently, lasting from 4-6 hours !!!   Total sleep time in 24 hours would be more than 11 hours ! .    Review of Systems: Out of a complete 14 system review, the patient complains of only the following symptoms, and all other reviewed systems are negative.:  Fatigue, sleepiness , snoring, fragmented sleep, nocturia, coughing spells, rhinitis. Snorer.    Epworth Sleepiness Scale 0= would never doze 1= slight chance of dozing 2= moderate chance of dozing 3= high chance of dozing  Sitting and reading:1 Watching TV: 1 Sitting inactive in a public place (ex. Theater or meeting): 1 As a passenger in a car for an hour without a break: 1 Lying down to rest in the afternoon: 0 Sitting and talking to someone: 0 Sitting quietly after lunch (no alcohol): 1 In a car, while stopped in traffic: 0 Total: 5  FSS 9Epworth Sleepiness Scale 0= would  never doze 1= slight chance of dozing 2= moderate chance of dozing 3= high chance of dozing  Sitting and reading:1 Watching  TV: 1 Sitting inactive in a public place (ex. Theater or meeting): 1 As a passenger in a car for an hour without a break: 1 Lying down to rest in the afternoon: 0 Sitting and talking to someone: 0 Sitting quietly after lunch (no alcohol): 1 In a car, while stopped in traffic: 0 Total: 5  FSS 9   FSS endorsed at 53/ 63 points. High degree of fatigue, high degree of anxiety and depression.    Social History   Socioeconomic History  . Marital status: Married    Spouse name: Not on file  . Number of children: Not on file  . Years of education: Not on file  . Highest education level: Not on file  Occupational History  . Not on file  Tobacco Use  . Smoking status: Former Smoker    Types: Cigarettes  . Smokeless tobacco: Never Used  Vaping Use  . Vaping Use: Never used  Substance and Sexual Activity  . Alcohol use: Not Currently    Comment: none in 5-6 years   . Drug use: No  . Sexual activity: Yes    Birth control/protection: None  Other Topics Concern  . Not on file  Social History Narrative   Lives at home with wife and mother   Right handed   Caffeine: about 2-3 cups/day   Social Determinants of Health   Financial Resource Strain: Not on file  Food Insecurity: Not on file  Transportation Needs: Not on file  Physical Activity: Not on file  Stress: Not on file  Social Connections: Not on file    History reviewed. No pertinent family history.  Past Medical History:  Diagnosis Date  . Anxiety   . Chronic kidney disease    removed 1 kidney  . COPD (chronic obstructive pulmonary disease) (HCC)   . Depression   . Dyspnea   . GERD (gastroesophageal reflux disease)   . Hypothyroidism   . Pneumonia   . Sleep apnea    does not use CPAP  . Suicidal behavior without attempted self-injury   . Thyroid disease     Past Surgical History:  Procedure Laterality Date  . CHOLECYSTECTOMY    . DRUG INDUCED ENDOSCOPY N/A 12/27/2019   Procedure: DRUG INDUCED ENDOSCOPY;   Surgeon: Christia Reading, MD;  Location: [facility];  Service: ENT;  Laterality: N/A;  . HERNIA REPAIR    . IMPLANTATION OF HYPOGLOSSAL NERVE STIMULATOR Right 08/11/2020   Procedure: IMPLANTATION OF HYPOGLOSSAL NERVE STIMULATOR;  Surgeon: Christia Reading, MD;  Location:  SURGERY CENTER;  Service: ENT;  Laterality: Right;  REQUESTING RNFA  . NEPHRECTOMY       Current Outpatient Medications on File Prior to Visit  Medication Sig Dispense Refill  . acetaminophen (TYLENOL) 500 MG tablet Take 1,000-1,500 mg by mouth every 6 (six) hours as needed for headache (pain).    Marland Kitchen atorvastatin (LIPITOR) 40 MG tablet Take 1 tablet (40 mg total) by mouth at bedtime. (Patient taking differently: Take 20 mg by mouth at bedtime.) 30 tablet 0  . carbamide peroxide (DEBROX) 6.5 % OTIC solution Place 2 drops into both ears every 30 (thirty) days.    . famotidine (PEPCID) 20 MG tablet Take 20 mg by mouth daily.    Marland Kitchen levothyroxine (SYNTHROID, LEVOTHROID) 112 MCG tablet Take 1 tablet (112 mcg total) by mouth daily before  breakfast. For hypothyroidism    . ondansetron (ZOFRAN-ODT) 4 MG disintegrating tablet Take 4 mg by mouth every 8 (eight) hours as needed.    Bertram Gala Glycol-Propyl Glycol (LUBRICANT EYE DROPS) 0.4-0.3 % SOLN Place 1 drop into both eyes daily as needed (dry/irritated eyes.).     No current facility-administered medications on file prior to visit.    Allergies  Allergen Reactions  . Trazodone And Nefazodone Other (See Comments)    Causes muscle jerks/tremors per spouse  . Latex Rash    NO POWDERED GLOVES, please!!    Physical exam:  Today's Vitals   05/09/21 1603  BP: 112/80  Pulse: 73  Weight: 195 lb (88.5 kg)  Height: 5\' 4"  (1.626 m)   Body mass index is 33.47 kg/m.   Wt Readings from Last 3 Encounters:  05/09/21 195 lb (88.5 kg)  09/13/20 192 lb (87.1 kg)  08/11/20 194 lb 3.6 oz (88.1 kg)     Ht Readings from Last 3 Encounters:  05/09/21 5\' 4"  (1.626 m)   09/13/20 5\' 4"  (1.626 m)  08/11/20 5\' 4"  (1.626 m)     The patient was deemed fit for an Inspire device and Dr. 09/15/20 implanted on 08-11-20. His scar is well healed.   General: The patient is awake, alert and appears not in acute distress. The patient is poorly groomed. Head: Normocephalic, atraumatic. Neck is supple. Mallampati 3 plus, poor dentition, extremely crowded dentition and small upper jaw, prognathia.   neck circumference:20.5 inches .  Nasal airflow barely  patent.  Cardiovascular:  Regular rate and cardiac rhythm by pulse,  without distended neck veins. Respiratory: Lungs are clear to auscultation. Audible breathing.  Skin:  Without evidence of ankle edema, or rash. Trunk: The patient's posture is stooped. .   Neurologic exam :   Cranial nerves: no loss of smell or taste reported  Pupils are equal and briskly reactive to light. Funduscopic exam deferred.   Extraocular movements in vertical and horizontal planes were intact and without nystagmus. No Diplopia. Visual fields by finger perimetry are intact. Hearing was impaired  to soft voice and finger rubbing.    Facial sensation intact to fine touch. Full facial hair.   Facial motor strength is symmetric and tongue and uvula move midline.  Neck ROM : rotation, tilt and flexion extension were normal for age and shoulder shrug was symmetrical.      After spending a total time of 25 minutes face to face and additional time for physical and neurologic examination, review of laboratory studies,  personal review of imaging studies, reports and results of other testing and review of referral information / records as far as provided in visit, I have established the following assessments:  I was able to interrogate the device and reset the therapy from 2.1 V to an amplitude of 1.8 V today the patient control will range from 1.6 through 2.3 V.  This is a reduction by 3 steps from his last setting.  The start delay, pulse time and  therapy duration will remain the same.  Stimulation is plus minus plus with a pulse width of 90 rate of 33 and a maximum stimulation time of 4.  Sensation voltage was documented as beginning at 0.9 but he originally started so he is already at a higher rate today.  I also asked him to be encouraged to reduce the setting should he have discomfort at night again.   Electronically signed by: 08/13/20, MD 05/09/2021 4:21 PM  [facility] and [employer] certified by [employer] of Sleep Medicine and Diplomate of the [employer] of Sleep Medicine. Board certified In Neurology through the [employer], Fellow of the [employer] of Neurology. Medical Director of [employer].

## 2021-06-04 ENCOUNTER — Telehealth: Payer: Self-pay | Admitting: Neurology

## 2021-06-04 NOTE — Telephone Encounter (Signed)
Pt's wife called stating that the pt is still on the same level and is still keeping awake and is still having dry mouth. Wife Lupita Leash will like to speak to the RN to be advised.

## 2021-06-05 NOTE — Telephone Encounter (Signed)
Called the patient's wife.  She states that continues to struggle with turning the device on.  In regards to the dry mouth, pt's mouth opens up in his sleep. Encourgaged the pt should keep water at bedside and can also try rinsing with biotene. Advised The wife indicates that she usually has to turn around when he is falling asleep.  She will recommend/ask him about turning it on and he says now he will wait.  She has turned down the voltage on the device.  He states it still wakes him up and is bothersome.  Patient is not scheduled for a follow-up appointment.  Advised the wife at this time will need to schedule a visit with the inspire rep and Dr. Vickey Huger for them to discuss the patient using the device whether to discontinue if he is not going to use it.  Patient was scheduled for 07/17/21 at 8:30 am

## 2021-06-15 ENCOUNTER — Telehealth: Payer: Self-pay | Admitting: Neurology

## 2021-06-15 NOTE — Telephone Encounter (Signed)
Visit with patient- 05-25-2021  INSPIRE        I was able to interrogate the device and reset the therapy from 2.1 V to an amplitude of 1.8 V today the patient control will range from 1.6 through 2.3 V.  This is a reduction by 3 steps from his last setting.   The start delay, pulse time, and therapy duration will remain the same.   Stimulation is plus minus plus with a pulse width of 90 rate of 33 and a maximum stimulation time of 4.  Sensation voltage was documented as beginning at 0.9 but he originally started so he is already at a higher rate today.   I also asked him to be encouraged to reduce the setting should he have discomfort at night again.

## 2021-07-17 ENCOUNTER — Encounter: Payer: Self-pay | Admitting: Neurology

## 2021-07-17 ENCOUNTER — Telehealth: Payer: Self-pay

## 2021-07-17 ENCOUNTER — Ambulatory Visit: Payer: Medicare HMO | Admitting: Neurology

## 2021-07-17 DIAGNOSIS — Z459 Encounter for adjustment and management of unspecified implanted device: Secondary | ICD-10-CM | POA: Diagnosis not present

## 2021-07-17 DIAGNOSIS — Z72821 Inadequate sleep hygiene: Secondary | ICD-10-CM

## 2021-07-17 DIAGNOSIS — G4733 Obstructive sleep apnea (adult) (pediatric): Secondary | ICD-10-CM | POA: Diagnosis not present

## 2021-07-17 NOTE — Patient Instructions (Signed)

## 2021-07-17 NOTE — Progress Notes (Addendum)
SLEEP MEDICINE CLINIC    Provider:  Melvyn Novas, MD  Primary Care Physician:  Melvenia Beam, MD [address]  [address] [place] [address]     "I heard about inspire on TV " Referring Provider: Dr Jenne Pane, MD  ENT       Chief Complaint according to patient   Patient presents with:     New Patient (Initial Visit)     RV of inspire patient - with hypoxia and mixed apnea,       07-17-2021 interval history ; Mr Lagace is followed here after consultation requested by Dr Jenne Pane, MD ENT . He has a cleft palate and and his inspire stimulation at currently 1.9 V has let to a left lateral protrusion of the tongue. He has called here stating that this initially comfortable setting now wakes him up and is uncomfortable. In the presence of INSPIRE Rep Thayer Ohm, we trying to find the most comfortable setting and also variety of stimulation patterns, away from Plus-minus-plus setting.  The patient had forgotten to bring his remote control to this visit.  The battery was still full.    MORGON PAMER is a 60 y.o. year old  Caucasian male patient seen on 07/17/2021 from Dr Jenne Pane, MD, ENT> Epworth score is 7 points today.  The patient has a long psychiatric history that accounts for cyclic hypersomnia with long sleep times, often in daytime. This is not an organic finding. The patient is not able or willing to adhere to bedtimes and avoiding naps in daytime.     HISTORY OF PRESENT ILLNESS: first seen at [facility] after Inspire implanted 08-12-2020. The patient was not a [facility] patient until after implantation. . Last visit was on 18 January 2021 - he had been doing well with the inspire device in February and March 22- as he approached level 7, the device caused him wake up at night. He returned to level 4 and still feels bothered.  The patient's wife has noticed that his tongue approaches the lower teeth line, but the patient feels as if his tongue is just buckling instead of protruding. He  does not feel that the tongue deviates to either side.  He describes the sensation that wakes him as a discomfort not truly of pain, but he has been using the device now very irregularly over the last 4 weeks or so. Hypersomnia in a cyclic fashion is present, and he has been known to sleep sometimes all day for 3-5 days in a row. He never has lees than 5 hours of sleep.  The patient's setting on February 3 was quoted below initially at 1.9 amplitude voltage he was increased to 2.0 the patient's own control ranges now between 1.8 and 2.4 V start delay is 30 minutes pulse is 50 minutes and therapy duration was set at 10 hours overnight.  Battery is fully charged usage per hour was 387 hours over the last and only 70 over the last week.  I was able to interrogate the device and reset the therapy from 2.1 V to an amplitude of 1.8 V today the patient control will range from 1.6 through 2.3 V.  This is a reduction by 3 steps from his last setting.  The start delay, pulse time and therapy duration will remain the same.  Stimulation is plus minus plus with a pulse width of 90 rate of 33 and a maximum stimulation time of 4.  Sensation voltage was documented as  beginning at 0.9 but he originally started so he is already at a higher rate today.  I also asked him to be encouraged to reduce the setting should he have discomfort at night again.    01-18-2021/  I am meeting with Mr. Sherril and his wife, he is here for further titration on his inspire device.  The patient remains excessively daytime sleepy on his current settings of 1.9 V.  It seems that he sleeps between 12 and 15 hours a day according to his wife.  The patient has significant hearing loss which makes our communication a little bit more difficult.  He quit using CPAP over 5 years ago. He has still been in need of oxygen . He continued to use tobacco.      He does not pause the therapy at night for example for bathroom break or etc.  His wife has  sometimes restarted the device at about 8 AM when she gets up in the morning at  0.7Volts.   The amplitude at 100% of the time was 1.9 V.  The incoming patient control allows for an increase to up to 2.7 V.  His preimplant visit with us showed 25 AHI before therapy and during titration on 18 December 2020 an AHI of 4 the therapeutic level that we found was 2.2 V so he is still at this time below the recommended level.  I would like to add that his 1.9 V setting at which she is currently revealed during the sleep study the possibility for an REM AHI of 54.5/h so this was not at all enough to control his apnea.  The patient also had hypoxemia at night which may not be controlled under inspire at all.  So today we will and prolong the therapy duration from 8 to 10 hours we will set the upper limits of his voltage today for stimulation to 2.2 be set his goal to level 5 and configure rated today the patient and able to control to be between 1.8 fold and 2.5 V.  Start delay will remain at 13-minute even more if he wanted overstimulation amplitude will be set today to 2.0 up from  1.9 Volt.   I would like to see if the patient responds with a better level of alertness after increasing dose settings current fatigue severity score was endorsed at 53 points which is very high and the Epworth sleepiness score is still at 16 points-  Will adjusted today to settings on the inspire therapy and increase in the incoming voltage from 1.9-2.0 which brings him a little closer to the goal of 2.2 V as discovered during his sleep study on 3 January.  We will also wait until he reaches that goal before we order a home sleep test to follow-up.  We need to make sure that he does not have the same hypoxemia that was revealed in his titration study, he also had a very strong REM dependent sleep apnea which may not respond.  This may explain some of the fatigue and persistent sleepiness.  I think he is truly not at goal and we will slowly as  comfortable as possibly approach the higher settings we also increase the therapy time from 8 to 10hours we discussed sleep hygiene changes, and a revisit can be done in 3 months when he should have reached the 2.2 V right therapeutic setting stage V.   . CONSULTATION _  Chief concern according to patient :   Mr. Truett PernaSherrill reports  that he is a patient suffering from obstructive sleep apnea he underwent a sleep study many many years ago.  He could not remember at what date -but over a decade ago he was tested for apnea.  Apparently he was not able to tolerate CPAP.  He was then evaluated by ENT for the inspire device implant.  He needs to have to undergo another repeat sleep study to rule out significant central apneas.  This will detect if he has any barriers to benefit from inspire therapy.  He has excessive daytime sleepiness but he is also hypersomniac sleeping 10 to 12 hours a day.  In June 2021 he was hospitalized for pneumonia.  He still has coughing intermittent and it also interrupts his sleep as to nocturia.  I found history of a sleep study "it was a result of an AHI of 25 without significant central events.  The sleep study was not done in our lab.  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  It is listed here that he actually had a sleep study just in 2020 which is not what he told me.  I reviewed his medications as well.  Risperidone, famotidine-Pepcid Synthroid, Wellbutrin, Zithromax, Lipitor Tylenol as needed.   I have  the pleasure of seeing STANLEE ROEHRIG today, a right -handed  Caucasian male , who has a past medical history of GAD/ Anxiety, Chronic kidney disease, COPD (chronic obstructive pulmonary disease) (HCC), Depression, Dyspnea, GERD (gastroesophageal reflux disease), Hypothyroidism, Pneumonia, Sleep apnea, Suicidal behavior without attempted self-injury, and Thyroid disease..   and ongoing exposure to cigarette smoke - living with mother (65), a smoker. He is a former smoker-  The patient had the first sleep study in the year 2005(?)- it was at  [facility].  He was prescribed a CPAP but couldn't use it- it caused dryness in mouth and nose. Sleep relevant medical history: Nocturia times 2-3 , no Tonsillectomy,he is chronically suffering from nasal congestion.   Family medical /sleep history: no other family member on CPAP with OSA, insomnia, sleep walkers.    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   Pets are present.a dog and a cat.  Tobacco use- former and ongoing passive exposure .  ETOH use ; rare, sober for 4 years,  Caffeine intake in form of Coffee( 2 cup) Soda( sometimes) Tea ( sometimes) or energy drinks. Regular exercise -none      Sleep habits are as follows: The patient's dinner time is between 6.30 PM. The patient goes to bed at 10.30 PM and continues to sleep in intervals of 2-4 hours, wakes for 2-3 bathroom breaks.   The preferred sleep position is on his right side , with the support of 3 pillows.he needs to elevate his head chest.  Dreams are reportedly rare.  8 AM is the usual rise time. The patient wakes up spontaneously at 7 AM .  He reports not commonly feeling refreshed or restored in AM, with symptoms such as dry mouth, coughing , morning headaches, and residual fatigue.  Naps are taken frequently, lasting from 4-6 hours !!!   Total sleep time in 24 hours would be more than 11 hours !     DIAGNOSIS by follow up sleep study. : The previously diagnosed Obstructive Sleep Apnea responded partially to INSPIRE therapy- for NREM sleep the AHI was significantly reduced under inspire therapy. At a setting of 2.2V there was a reduction to an AHI of 5.1/h. Critical point was the consistent REM sleep apnea- not central apnea. The patient presented with REM AHI of 54.5/h at 1.9 V setting, in left lateral sleep position and with REM AHI of 45 at 2.2V setting, still in left lateral sleep position. Total desaturation time for oxygen was still high at 54 minutes, but hypoxia was not noted as a continuous finding, not lasting more than 90 seconds at a time.   PLANS/RECOMMENDATIONS: Inspire therapy at 2.2 V will control the majority of NREM sleep stage related apneas/hypopneas. REM dependent apnea remains present. Overall, the OSA is reduced by 50% and this placed the patient into a mild category of sleep apnea that can further improve with weight loss. His persistent hypoxemia is likely related to COPD and may require additional COPD treatment or even oxygen at night.    Review of Systems: Out of a complete 14 system review, the patient complains of only the following symptoms, and all other reviewed systems are negative.:  Fatigue, sleepiness , snoring, fragmented sleep, nocturia, coughing spells, rhinitis. Snorer.    Epworth Sleepiness Scale 0= would never doze 1= slight chance of dozing 2= moderate chance of dozing 3= high chance of dozing   Sitting and reading:1 Watching TV: 1 Sitting inactive in a public place (ex. Theater or meeting): 1 As a passenger in a car for an hour without a break: 1 Lying down to rest in the afternoon: 0 Sitting and talking to someone: 0 Sitting quietly after lunch (no alcohol): 1 In a car, while stopped in traffic: 0 Total: 7    FSS endorsed at 53/ 63 points. High degree of fatigue, high degree of anxiety and depression.    Social History    Socioeconomic History   Marital status: Married    Spouse name: Not on file   Number of children: Not on file   Years of education: Not on file   Highest education level: Not on file  Occupational History   Not on file  Tobacco Use   Smoking status: Former    Types: Cigarettes   Smokeless tobacco: Never  Vaping Use   Vaping Use: Never used  Substance and Sexual Activity   Alcohol use: Not Currently    Comment: none in 5-6 years    Drug use: No   Sexual activity: Yes    Birth control/protection: None  Other Topics Concern   Not on file  Social History Narrative   Lives at home with wife and mother   Right handed   Caffeine: about 2-3 cups/day   Social Determinants of Health   Financial Resource Strain: Not on file  Food Insecurity: Not on file  Transportation Needs: Not on file  Physical Activity: Not on file  Stress: Not on file  Social Connections: Not on file      Past Medical History:  Diagnosis Date   Anxiety    Chronic kidney disease    removed 1 kidney   COPD (chronic obstructive pulmonary disease) (HCC)    Depression    Dyspnea    GERD (gastroesophageal reflux disease)  Hypothyroidism    Pneumonia    Sleep apnea    does not use CPAP   Suicidal behavior without attempted self-injury    Thyroid disease     Past Surgical History:  Procedure Laterality Date   CHOLECYSTECTOMY     DRUG INDUCED ENDOSCOPY N/A 12/27/2019   Procedure: DRUG INDUCED ENDOSCOPY;  Surgeon: Christia Reading, MD;  Location: [facility];  Service: ENT;  Laterality: N/A;   HERNIA REPAIR     IMPLANTATION OF HYPOGLOSSAL NERVE STIMULATOR Right 08/11/2020   Procedure: IMPLANTATION OF HYPOGLOSSAL NERVE STIMULATOR;  Surgeon: Christia Reading, MD;  Location: [facility];  Service: ENT;  Laterality: Right;  REQUESTING RNFA   NEPHRECTOMY       Current Outpatient Medications on File Prior to Visit  Medication Sig Dispense Refill   acetaminophen (TYLENOL) 500 MG  tablet Take 1,000-1,500 mg by mouth every 6 (six) hours as needed for headache (pain).     atorvastatin (LIPITOR) 40 MG tablet Take 1 tablet (40 mg total) by mouth at bedtime. (Patient taking differently: Take 20 mg by mouth at bedtime.) 30 tablet 0   carbamide peroxide (DEBROX) 6.5 % OTIC solution Place 2 drops into both ears every 30 (thirty) days.     famotidine (PEPCID) 20 MG tablet Take 20 mg by mouth daily.     levothyroxine (SYNTHROID, LEVOTHROID) 112 MCG tablet Take 1 tablet (112 mcg total) by mouth daily before breakfast. For hypothyroidism     ondansetron (ZOFRAN-ODT) 4 MG disintegrating tablet Take 4 mg by mouth every 8 (eight) hours as needed.     Polyethyl Glycol-Propyl Glycol (LUBRICANT EYE DROPS) 0.4-0.3 % SOLN Place 1 drop into both eyes daily as needed (dry/irritated eyes.).     No current facility-administered medications on file prior to visit.    Allergies  Allergen Reactions   Trazodone And Nefazodone Other (See Comments)    Causes muscle jerks/tremors per spouse   Latex Rash    NO POWDERED GLOVES, please!!    Physical exam:  There were no vitals filed for this visit.  There is no height or weight on file to calculate BMI.   Wt Readings from Last 3 Encounters:  05/09/21 195 lb (88.5 kg)  09/13/20 192 lb (87.1 kg)  08/11/20 194 lb 3.6 oz (88.1 kg)     Ht Readings from Last 3 Encounters:  05/09/21 5\' 4"  (1.626 m)  09/13/20 5\' 4"  (1.626 m)  08/11/20 5\' 4"  (1.626 m)     The patient was deemed fit for an Inspire device and Dr. implanted on 08-11-20. His scar is well healed.   General: The patient is awake, alert and appears not in acute distress. The patient is poorly groomed. Head: Normocephalic, atraumatic. Neck is supple. Mallampati 3 plus, poor dentition, extremely crowded dentition and small upper jaw, prognathia.   neck circumference:20.5 inches .  Nasal airflow barely  patent.  Cardiovascular:  Regular rate and cardiac rhythm by pulse,  without  distended neck veins. Respiratory: Lungs are clear to auscultation. Audible breathing.  Skin:  Without evidence of ankle edema, or rash. Trunk: The patient's posture is stooped. .   Neurologic exam :   Cranial nerves: no loss of smell or taste reported  Pupils are equal and briskly reactive to light. Funduscopic exam deferred.   Extraocular movements in vertical and horizontal planes were intact and without nystagmus. No Diplopia. Visual fields by finger perimetry are intact. Hearing was impaired  to soft voice and finger  rubbing.    Facial sensation intact to fine touch. Full facial hair.   Facial motor strength is symmetric and tongue and uvula move midline.  Neck ROM : rotation, tilt and flexion extension were normal for age and shoulder shrug was symmetrical.        After spending a total time of 25 minutes face to face and additional time for physical and neurologic examination, review of laboratory studies,  personal review of imaging studies, reports and results of other testing and review of referral information / records as far as provided in visit, I have established the following assessments:  Resetting of 3 or more functions of this INSPIRE device.   Rv in 4-6 weeks.   Electronically signed by: Melvyn Novas, MD 07/17/2021 8:42 AM  [facility] Board certified by The [employer] of Sleep Medicine and Diplomate of the [employer] of [employer] Medicine. Board certified In Neurology through the ABPN, Fellow of the [employer] of Neu[employer]. Medical Director of [facility].

## 2021-08-28 ENCOUNTER — Encounter: Payer: Self-pay | Admitting: Neurology

## 2021-08-28 ENCOUNTER — Ambulatory Visit: Payer: Medicare HMO | Admitting: Neurology

## 2021-08-28 VITALS — BP 129/82 | HR 71 | Ht 64.0 in | Wt 194.5 lb

## 2021-08-28 DIAGNOSIS — G4733 Obstructive sleep apnea (adult) (pediatric): Secondary | ICD-10-CM | POA: Diagnosis not present

## 2021-08-28 DIAGNOSIS — Z789 Other specified health status: Secondary | ICD-10-CM | POA: Diagnosis not present

## 2021-08-28 DIAGNOSIS — Q359 Cleft palate, unspecified: Secondary | ICD-10-CM | POA: Diagnosis not present

## 2021-08-28 DIAGNOSIS — Z459 Encounter for adjustment and management of unspecified implanted device: Secondary | ICD-10-CM | POA: Diagnosis not present

## 2021-08-28 NOTE — Patient Instructions (Signed)
Tongue motion seen at level 5 (1.1V) and 6 ( 1.2 V) is good.  Demonstrated stimulation of tongue movement.  Electrode minus- zero- minus No changes in settings today. Devin Joseph is here to assist.   Will bring him back for a sleep study in the lab, starting  at 1.1 V and titrate upwards form there. This should be done in 12-16 weeks. Early January .

## 2021-08-28 NOTE — Progress Notes (Signed)
SLEEP MEDICINE CLINIC    Provider:  Melvyn Novas, MD  Primary Care Physician:  Melvenia Beam, MD [address]  [address] [place] [address]     "I heard about inspire on TV " Referring Provider: Dr Jenne Pane, MD  ENT       Chief Complaint according to patient   Patient presents with:     New Patient (Initial Visit)     RV of inspire patient - with hypoxia and mixed apnea,      08-28-2021 Rv for this inspire patient who is now at level 6 and he had briefly tried at level 7 ( 1.3V) - that felt too much. Sleep quality is good, snoring is diminished. He sleeps for over 6-8 hours , some night . Therapy duration is set for 10 hours, tomorrow he has an hemorrhoidectomy. Planned for general anaesthesia. Has been in pain, interrupted sleep from that.  Tongue motion seen at level 5 and 6 ( 1.2 V) is good.  Demonstrated stimulation of tongue movement.  No changes in settings today. [name] is here to assist.   Will bring him back for a sleep study in the lab, starting  at 1.1 V and titrate upwards form there. This should be done in 12-16 weeks. Early January .    07-17-2021 interval history ; Mr Pierpoint is followed here after consultation requested by Dr Jenne Pane, MD ENT . He has a cleft palate and and his inspire stimulation at currently 1.9 V has let to a left lateral protrusion of the tongue. He has called here stating that this initially comfortable setting now wakes him up and is uncomfortable. In the presence of INSPIRE Rep Thayer Ohm, we trying to find the most comfortable setting and also variety of stimulation patterns, away from Plus-minus-plus setting.  The patient had forgotten to bring his remote control to this visit.  The battery was still full.    CANAAN HOLZER is a 60 y.o. year old  Caucasian male patient seen on 08/28/2021 from Dr Jenne Pane, MD, ENT> Epworth score is 7 points today.  The patient has a long psychiatric history that accounts for cyclic hypersomnia  with long sleep times, often in daytime. This is not an organic finding. The patient is not able or willing to adhere to bedtimes and avoiding naps in daytime.     HISTORY OF PRESENT ILLNESS: first seen at [facility] after Inspire implanted 08-12-2020. The patient was not a [facility] patient until after implantation. . Last visit was on 18 January 2021 - he had been doing well with the inspire device in February and March 22- as he approached level 7, the device caused him wake up at night. He returned to level 4 and still feels bothered.  The patient's wife has noticed that his tongue approaches the lower teeth line, but the patient feels as if his tongue is just buckling instead of protruding. He does not feel that the tongue deviates to either side.  He describes the sensation that wakes him as a discomfort not truly of pain, but he has been using the device now very irregularly over the last 4 weeks or so. Hypersomnia in a cyclic fashion is present, and he has been known to sleep sometimes all day for 3-5 days in a row. He never has lees than 5 hours of sleep.  The patient's setting on February 3 was quoted below initially at 1.9 amplitude voltage he was increased to 2.0  the patient's own control ranges now between 1.8 and 2.4 V start delay is 30 minutes pulse is 50 minutes and therapy duration was set at 10 hours overnight.  Battery is fully charged usage per hour was 387 hours over the last and only 70 over the last week.  I was able to interrogate the device and reset the therapy from 2.1 V to an amplitude of 1.8 V today the patient control will range from 1.6 through 2.3 V.  This is a reduction by 3 steps from his last setting.  The start delay, pulse time and therapy duration will remain the same.  Stimulation is plus minus plus with a pulse width of 90 rate of 33 and a maximum stimulation time of 4.  Sensation voltage was documented as beginning at 0.9 but he originally started so he is already at a higher  rate today.  I also asked him to be encouraged to reduce the setting should he have discomfort at night again.    01-18-2021/  I am meeting with Mr. Sherril and his wife, he is here for further titration on his inspire device.  The patient remains excessively daytime sleepy on his current settings of 1.9 V.  It seems that he sleeps between 12 and 15 hours a day according to his wife.  The patient has significant hearing loss which makes our communication a little bit more difficult.  He quit using CPAP over 5 years ago. He has still been in need of oxygen . He continued to use tobacco.      He does not pause the therapy at night for example for bathroom break or etc.  His wife has sometimes restarted the device at about 8 AM when she gets up in the morning at  0.7Volts.   The amplitude at 100% of the time was 1.9 V.  The incoming patient control allows for an increase to up to 2.7 V.  His preimplant visit with US showed 25 AHI before therapy and during titration on 18 December 2020 an AHI of 4 the therapeutic level that we found was 2.2 V so he is still at this time below the recommended level.  I would like to add that his 1.9 V setting at which she is currently revealed during the sleep study the possibility for an REM AHI of 54.5/h so this was not at all enough to control his apnea.  The patient also had hypoxemia at night which may not be controlled under inspire at all.  So today we will and prolong the therapy duration from 8 to 10 hours we will set the upper limits of his voltage today for stimulation to 2.2 be set his goal to level 5 and configure rated today the patient and able to control to be between 1.8 fold and 2.5 V.  Start delay will remain at 13-minute even more if he wanted overstimulation amplitude will be set today to 2.0 up from  1.9 Volt.   I would like to see if the patient responds with a better level of alertness after increasing dose settings current fatigue severity score was endorsed  at 53 points which is very high and the Epworth sleepiness score is still at 16 points-  Will adjusted today to settings on the inspire therapy and increase in the incoming voltage from 1.9-2.0 which brings him a little closer to the goal of 2.2 V as discovered during his sleep study on 3 January.  We will also wait  until he reaches that goal before we order a home sleep test to follow-up.  We need to make sure that he does not have the same hypoxemia that was revealed in his titration study, he also had a very strong REM dependent sleep apnea which may not respond.  This may explain some of the fatigue and persistent sleepiness.  I think he is truly not at goal and we will slowly as comfortable as possibly approach the higher settings we also increase the therapy time from 8 to 10hours we discussed sleep hygiene changes, and a revisit can be done in 3 months when he should have reached the 2.2 V right therapeutic setting stage V.   . CONSULTATION _  Chief concern according to patient :   Mr. Kruckenberg reports that he is a patient suffering from obstructive sleep apnea he underwent a sleep study many many years ago.  He could not remember at what date -but over a decade ago he was tested for apnea.  Apparently he was not able to tolerate CPAP.  He was then evaluated by ENT for the inspire device implant.  He needs to have to undergo another repeat sleep study to rule out significant central apneas.  This will detect if he has any barriers to benefit from inspire therapy.  He has excessive daytime sleepiness but he is also hypersomniac sleeping 10 to 12 hours a day.  In June 2021 he was hospitalized for pneumonia.  He still has coughing intermittent and it also interrupts his sleep as to nocturia.  I found history of a sleep study "it was a result of an AHI of 25 without significant central events.  The sleep study was not done in our lab.  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  It is listed here that he actually had a sleep study just in 2020 which is not what he told me.  I reviewed his medications as well.  Risperidone, famotidine-Pepcid Synthroid, Wellbutrin, Zithromax, Lipitor Tylenol as needed.   I have the pleasure of seeing HASEEB FIALLOS today, a right -handed  Caucasian male , who has a past medical history of GAD/ Anxiety, Chronic kidney disease, COPD (chronic obstructive pulmonary disease) (HCC), Depression, Dyspnea, GERD (gastroesophageal reflux disease), Hypothyroidism, Pneumonia, Sleep apnea, Suicidal behavior without attempted self-injury, and Thyroid disease..   and ongoing exposure to cigarette smoke - living with mother (31), a smoker. He is a former smoker-  The patient had the first sleep study in the year 2005(?)- it was at  [facility].  He was prescribed a CPAP but couldn't use it- it caused dryness in mouth and nose. Sleep relevant medical history: Nocturia times 2-3 , no Tonsillectomy,he is chronically suffering from nasal congestion.   Family medical /sleep history: no other family member on CPAP with OSA, insomnia, sleep walkers.    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   Pets are present.a dog and a cat.  Tobacco use- former and ongoing passive exposure .  ETOH use ; rare,  sober for 4 years,  Caffeine intake in form of Coffee( 2 cup) Soda( sometimes) Tea ( sometimes) or energy drinks. Regular  exercise -none      Sleep habits are as follows: The patient's dinner time is between 6.30 PM. The patient goes to bed at 10.30 PM and continues to sleep in intervals of 2-4 hours, wakes for 2-3 bathroom breaks.   The preferred sleep position is on his right side , with the support of 3 pillows.he needs to elevate his head chest.  Dreams are reportedly rare.  8 AM is the usual rise time. The patient wakes up spontaneously at 7 AM .  He reports not commonly feeling refreshed or restored in AM, with symptoms such as dry mouth, coughing , morning headaches, and residual fatigue.  Naps are taken frequently, lasting from 4-6 hours !!!   Total sleep time in 24 hours would be more than 11 hours !    DIAGNOSIS by follow up sleep study. : The previously diagnosed Obstructive Sleep Apnea responded partially to INSPIRE therapy- for NREM sleep the AHI was significantly reduced under inspire therapy. At a setting of 2.2V there was a reduction to an AHI of 5.1/h. Critical point was the consistent REM sleep apnea- not central apnea. The patient presented with REM AHI of 54.5/h at 1.9 V setting, in left lateral sleep position and with REM AHI of 45 at 2.2V setting, still in left lateral sleep position. Total desaturation time for oxygen was still high at 54 minutes, but hypoxia was not noted as a continuous finding, not lasting more than 90 seconds at a time.   PLANS/RECOMMENDATIONS: Inspire therapy at 2.2 V will control the majority of NREM sleep stage related apneas/hypopneas. REM dependent apnea remains present. Overall, the OSA is reduced by 50% and this placed the patient into a mild category of sleep apnea that can further improve with weight loss. His persistent hypoxemia is likely related to COPD and may require additional COPD treatment or even oxygen at night.    Review of Systems: Out of a complete 14 system review, the patient complains of only the following symptoms, and all other reviewed systems  are negative.:  Fatigue, sleepiness , snoring, fragmented sleep, nocturia, coughing spells, rhinitis. Snorer.    Epworth Sleepiness Scale 0= would never doze 1= slight chance of dozing 2= moderate chance of dozing 3= high chance of dozing   Sitting and reading:1 Watching TV: 1 Sitting inactive in a public place (ex. Theater or meeting): 1 As a passenger in a car for an hour without a break: 1 Lying down to rest in the afternoon: 0 Sitting and talking to someone: 0 Sitting quietly after lunch (no alcohol): 1 In a car, while stopped in traffic: 0 Total: 7    FSS endorsed at 53/ 63 points. High degree of fatigue, high degree of anxiety and depression.    Social History   Socioeconomic History   Marital status: Married    Spouse name: Not on file   Number of children: Not on file   Years of education: Not on file   Highest education level: Not on file  Occupational History   Not on file  Tobacco Use   Smoking status: Former    Types: Cigarettes   Smokeless tobacco: Never  Vaping Use   Vaping Use: Never used  Substance and Sexual Activity   Alcohol use: Not Currently    Comment: none in 5-6 years    Drug use: No  Sexual activity: Yes    Birth control/protection: None  Other Topics Concern   Not on file  Social History Narrative   Lives at home with wife and mother   Right handed   Caffeine: about 2-3 cups/day   Social Determinants of Health   Financial Resource Strain: Not on file  Food Insecurity: Not on file  Transportation Needs: Not on file  Physical Activity: Not on file  Stress: Not on file  Social Connections: Not on file      Past Medical History:  Diagnosis Date   Anxiety    Chronic kidney disease    removed 1 kidney   COPD (chronic obstructive pulmonary disease) (HCC)    Depression    Dyspnea    GERD (gastroesophageal reflux disease)    Hypothyroidism    Pneumonia    Sleep apnea    does not use CPAP   Suicidal behavior without attempted  self-injury    Thyroid disease     Past Surgical History:  Procedure Laterality Date   CHOLECYSTECTOMY     DRUG INDUCED ENDOSCOPY N/A 12/27/2019   Procedure: DRUG INDUCED ENDOSCOPY;  Surgeon: Christia Reading, MD;  Location: [facility];  Service: ENT;  Laterality: N/A;   HERNIA REPAIR     IMPLANTATION OF HYPOGLOSSAL NERVE STIMULATOR Right 08/11/2020   Procedure: IMPLANTATION OF HYPOGLOSSAL NERVE STIMULATOR;  Surgeon: Christia Reading, MD;  Location: [facility];  Service: ENT;  Laterality: Right;  REQUESTING RNFA   NEPHRECTOMY       Current Outpatient Medications on File Prior to Visit  Medication Sig Dispense Refill   acetaminophen (TYLENOL) 500 MG tablet Take 1,000-1,500 mg by mouth every 6 (six) hours as needed for headache (pain).     atorvastatin (LIPITOR) 40 MG tablet Take 1 tablet (40 mg total) by mouth at bedtime. (Patient taking differently: Take 20 mg by mouth at bedtime.) 30 tablet 0   carbamide peroxide (DEBROX) 6.5 % OTIC solution Place 2 drops into both ears every 30 (thirty) days.     famotidine (PEPCID) 20 MG tablet Take 20 mg by mouth daily.     levothyroxine (SYNTHROID, LEVOTHROID) 112 MCG tablet Take 1 tablet (112 mcg total) by mouth daily before breakfast. For hypothyroidism     ondansetron (ZOFRAN-ODT) 4 MG disintegrating tablet Take 4 mg by mouth every 8 (eight) hours as needed.     Polyethyl Glycol-Propyl Glycol (LUBRICANT EYE DROPS) 0.4-0.3 % SOLN Place 1 drop into both eyes daily as needed (dry/irritated eyes.).     Docusate Sodium (DSS) 250 MG CAPS Take by mouth.     No current facility-administered medications on file prior to visit.    Allergies  Allergen Reactions   Trazodone And Nefazodone Other (See Comments)    Causes muscle jerks/tremors per spouse   Latex Rash    NO POWDERED GLOVES, please!!    Physical exam:  Today's Vitals   08/28/21 1124  BP: 129/82  Pulse: 71  Weight: 194 lb 8 oz (88.2 kg)  Height: 5\' 4"  (1.626 m)     Body mass index is 33.39 kg/m.   Wt Readings from Last 3 Encounters:  08/28/21 194 lb 8 oz (88.2 kg)  05/09/21 195 lb (88.5 kg)  09/13/20 192 lb (87.1 kg)     Ht Readings from Last 3 Encounters:  08/28/21 5\' 4"  (1.626 m)  05/09/21 5\' 4"  (1.626 m)  09/13/20 5\' 4"  (1.626 m)     The patient was deemed fit for  an Inspire device and Dr. Jenne Pane implanted on 08-11-20. His scar is well healed.   General: The patient is awake, alert and appears not in acute distress. The patient is poorly groomed. Head: Normocephalic, atraumatic. Neck is supple. Mallampati 3 plus, poor dentition, extremely crowded dentition and small upper jaw, prognathia.   neck circumference:20.5 inches .  Nasal airflow barely  patent.  Cardiovascular:  Regular rate and cardiac rhythm by pulse,  without distended neck veins. Respiratory: Lungs are clear to auscultation. Audible breathing.  Skin:  Without evidence of ankle edema, or rash. Trunk: The patient's posture is stooped. .   Neurologic exam :   Cranial nerves: no loss of smell or taste reported  Pupils are equal and briskly reactive to light. Funduscopic exam deferred.   Extraocular movements in vertical and horizontal planes were intact and without nystagmus. No Diplopia. Visual fields by finger perimetry are intact. Hearing was impaired  to soft voice and finger rubbing.    Facial sensation intact to fine touch. Full facial hair.   Facial motor strength is symmetric and tongue and uvula move midline.  Neck ROM : rotation, tilt and flexion extension were normal for age and shoulder shrug was symmetrical.        After spending a total time of 30 minutes face to face and additional time for physical and neurologic examination, review of laboratory studies,  personal review of imaging studies, reports and results of other testing and review of referral information / records as far as provided in visit, I have established the following assessments:  Above  data were not changed today. Patient is at level 6. Chris set to level 5= 1.1 V and patient remains there until sleep study.     Tongue motion seen at level 5 and 6 ( 1.2 V) is good.  Demonstrated stimulation of tongue movement.  No changes in settings today. [name] is here to assist.   Will bring him back for a sleep study in the lab, starting  at 1.1 V and titrate upwards form there. This should be done in 12-16 weeks. Early January .   Rv in 4-6 weeks.   Electronically signed by: Melvyn Novas, MD 08/28/2021 11:53 AM  [facility] Board certified by The [employer] of Sleep Medicine and Diplomate of the [employer] of Sleep Medicine. Board certified In Neurology through the ABPN, Fellow of the [employer] of Neurology. Medical Director of [facility].

## 2022-01-28 ENCOUNTER — Ambulatory Visit: Payer: Medicare HMO | Admitting: Family Medicine

## 2022-03-15 ENCOUNTER — Telehealth: Payer: Self-pay | Admitting: Neurology

## 2022-03-15 NOTE — Telephone Encounter (Signed)
Pt's wife, Taron Weingartz (on [place]) question about using oxygen with the inspirer. Would like a call from the nurse. ?

## 2022-03-18 NOTE — Telephone Encounter (Signed)
Called the wife back and she notes that the patient was having moments where looked like he was taking some deep breaths in sleep. She placed a pulse ox on his finger and noted his oxygen levels will drop when he is sleeping. They have already contacted pulmonologist who is ordering a ONO to be completed while wearing his inspire. Once they have the results of this he may need oxygen. The pulmonologist wanted the pt to touch base with [place] since we evaluate and treat the sleep apnea with inspire to ensure it is ok to use oxygen if needed.  ?I advised that they have done the right steps and that the pulmonologist would be the one to order oxygen if needed. I assured her that the patient is ok to wear the oxygen at bedtime along with using his inspire. Advised that we would just need to know what the oxygen level would be set at.  ? ?**After further reviewing the chart it appears the patient is due to come in for a titration study to work the inspire up . This will allow the inspire to be titrated up and make sure that the apnea is being treated as well as oxygen level concerns.  ?Called the wife back and advised that she should hold off on scheduling the ONO study and complete this in lab study. Advised that if we get the inspire where it needs to be to treat the apnea and the oxygen level is still a concern then they should pursue ONO and get ordered from pulmonology if needed. She verbalized understanding. Advised that someone should be contacting her from the sleep lab soon to schedule the titration study.  ?

## 2022-03-28 ENCOUNTER — Ambulatory Visit (INDEPENDENT_AMBULATORY_CARE_PROVIDER_SITE_OTHER): Payer: Medicare HMO | Admitting: Neurology

## 2022-03-28 DIAGNOSIS — Z9682 Presence of neurostimulator: Secondary | ICD-10-CM

## 2022-03-28 DIAGNOSIS — G4733 Obstructive sleep apnea (adult) (pediatric): Secondary | ICD-10-CM | POA: Diagnosis not present

## 2022-03-28 DIAGNOSIS — Q359 Cleft palate, unspecified: Secondary | ICD-10-CM

## 2022-03-28 DIAGNOSIS — G4734 Idiopathic sleep related nonobstructive alveolar hypoventilation: Secondary | ICD-10-CM

## 2022-03-28 DIAGNOSIS — Z789 Other specified health status: Secondary | ICD-10-CM

## 2022-03-28 DIAGNOSIS — Z459 Encounter for adjustment and management of unspecified implanted device: Secondary | ICD-10-CM

## 2022-04-07 DIAGNOSIS — Z9682 Presence of neurostimulator: Secondary | ICD-10-CM | POA: Insufficient documentation

## 2022-04-07 DIAGNOSIS — G4734 Idiopathic sleep related nonobstructive alveolar hypoventilation: Secondary | ICD-10-CM | POA: Insufficient documentation

## 2022-04-07 NOTE — Procedures (Signed)
INSPIRE TITRATION-  RETITRATION. ?PATIENT'S NAME:  Devin Joseph, Devin Joseph ?DOB:      1961-07-17      ?MR#:    [id number]     ?DATE OF RECORDING: 03/28/2022 ?REFERRING M.D.:  Melida Quitter, MD ?Study Performed:  Polysomnogram with Inspire Titration ?HISTORY:  Devin Joseph is a 61 y.o. year old Caucasian male  ?patient seen on 09/13/2020 from Dr Redmond Baseman, MD, ENT, specifically  ?for an Inspire therapy related consultation. He was scheduled  ?by ENT for the inspire device implant and received the device in August 2021, before I ever met him.   ?Chief concern according to patient:   Mr. Haydon reports that he  ?is a patient suffering from obstructive sleep apnea and that he  ?underwent a sleep study many, many years ago.  He could not  ?remember at what date but assured me that this was over a decade  ?ago since he was tested for apnea.  Apparently, he was not able  ?to tolerate CPAP after the test confirmed OSA- attributed to his  ?cleft palate and to psychological factors.    ?He has excessive daytime sleepiness and has hypersomnia with prolonged sleep time, irregular sleep habits-, sleeping  ?10 to 12 hours a day.  In June 2021 he was hospitalized for  ?pneumonia.  He still has been coughing intermittently and it also  ?interrupts his sleep as to nocturia.  To my surprise I found a  ?reports of a recent sleep study in his [place] records: Result of an  ?AHI of 25/h without significant central events.  The sleep study  ?was not done in our lab. It is listed here that he actually had a  ?sleep study HST just in 2021, 3 months ago(!)- which he omitted. The  ?sleep study did not rule out significant REM sleep dependent  ?apnea. The colleagues did not state that there were barriers to  ?benefit from inspire therapy.    ? 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   ?   ?The patient, Devin Joseph, date of birth [birth year], presented at [place] [facility] for an Inspire overnight titration. The Pre-implant baseline AHI was 25/h, based on a H[facility] performed [facility] of [facility] The patient's hypoglossal nerve stimulator (INSPIRE) was implanted at [facility] on 08/11/2020 by Melida Quitter, MD.  ?The patient's device was activated at [facility] in late 2021 and initial activation voltage was set at 1.1V. The patient was comfortable while sufficient tongue movement was observed. He advanced to level 7 at home and by March 2022 he stated that the device stimulation was no longer comfortable, he reduced to level 4 and still felt bothered. ? Due to his underlying psychiatric illness, he had been a cyclic sleeper: Periods of hypersomnia alternate with insomnia, and he was not willing to work on sleep hygiene, avoiding naps and setting bed- and rise times.  He is severely hearing impaired,  an active smoker, he has anemia, GAD, BPH, Rectal fissure, COPD, a cleft palate and major Bipolar depression.  ? ?He was first invited in 2022 to an Inspire titration sleep study at [facility]: 12-18-2020: Dawna Part was initiated at a setting of 1.7 V and was advanced to 2.2V because of hypopneas, apneas and desaturations ?At a final setting of 2.2V there was a reduction to AHI of 5.1/h  ?but sleep was also highly fragmented. Hypoxia could not be eliminated. ?Critical point was the  consistent REM sleep apnea- not central  ?apnea. The patient presented with REM AHI of 54.5/h at 1.9 V  ?setting, in left lateral sleep position and with REM AHI of 45/h at  ?2.2V setting, still in left lateral sleep position. The patient has persistent hypoxia.  ?.BMI was 33.1 kg/m2.  ? ?Return for Mr. Devin Joseph. [place]ill, beginning with Inspire from baseline, reactivation after non-compliance  ?03-28-2022: The patient arrived reportedly at the sleep lab with an incoming amplitude of 1.1 V. This means he had not advanced with the treatment week by week as supposed to.   ?The patient's usage was 49 hours per week.   ?The patient reports minor subjective benefit.   ? ? ?CURRENT MEDICATIONS: Tylenol, Lipitor, Debrox, Pepcid, Synthroid, Zofran, eye drops, Docusate sodium ?  ?PROCEDURE:  This is a multichannel digital polysomnogram utilizing the Somnostar 11.2 system.  Electrodes and sensors were applied and monitored per AASM Specifications.   EEG, EOG, Chin and Limb EMG, were sampled at 200 Hz.  ECG, Snore and Nasal Pressure, Thermal Airflow, Respiratory Effort, CPAP Flow and Pressure, Oximetry was sampled at 50 Hz. Digital video and audio were recorded.     ? ?Titration Study: Inspire was turned on after the patient fell asleep, beginning with a starting amplitude at 0.9 V., which was 0.2 Volts below the Incoming amplitude of: 1.1 Volts.  ? Inspire device voltage was titrated to a maximum amplitude of 1.4 Volts.  ?Some Therapeutic effect was found at 1.4 Volts, as the AHI was 9.7/h, a 60 % reduction of the baseline.   ? ?After this sleep study was completed, the patient was programmed back to the Incoming Amplitude of 1.1 V.  In addition, the lower limit was programmed to 0.2 V below the incoming amplitude, and the upper limit was programmed to 1.0 V above the lower limit.   ? ?BASELINE STUDY: Lights Out was at 21:35 and Lights On at 04:43 AM.  Total recording time (TRT) was 428.5 minutes, with a total sleep time (TST)  of 340 minutes.   The patient's sleep latency was 15.5 ? minutes.  REM latency was 164.5 minutes. The sleep efficiency was 79.3 %. Sleep architecture was fragmented. There was very little REM sleep seen, and REM sleep remained associated with hypoxia, down to a nadir of 82% oxygen saturation at [facility].  ?  ?SLEEP ARCHITECTURE: WASO (Wake after sleep onset) was 46.5 minutes.  There were 38 minutes in Stage N1, 296 minutes Stage N2, 0 minutes Stage N3 and 6 minutes in Stage REM.  The percentage of Stage N1 was 11.2%, Stage N2 was 87.1%, Stage N3 was 0% and Stage R (REM sleep) was 1.8%.  ? ? ?RESPIRATORY ANALYSIS:  There were a total of 47 respiratory events:  28 obstructive apneas, 0 central apneas and 0 mixed apneas with a total of 91 apneas and an apnea index (AI) of 16.1 /hour. There were 18 hypopneas with a hypopnea index of 3.2 /hour. ?    ?  The total APNEA/HYPOPNEA INDEX (AHI) was 19.2 /hour.  The REM AHI was 10 /hour, versus a non-REM AHI of 16.2/h. The patient spent all 340 minutes of total sleep time in the supine. The supine AHI was 19.2. ?OXYGEN SATURATION & C02:  The baseline 02 saturation was 96%, with the lowest being 69%. Time spent below 89% saturation equaled 54 minutes. ?  ?PERIODIC LIMB MOVEMENTS:    ?The patient had a total of 28 Periodic Limb Movements. The Periodic Limb Movement (PLM) Arousal index was 0.2 /hour. ?Audio and video analysis did not show any abnormal or unusual movements, behaviors, phonations or vocalizations.  Snoring was no longer noted at last explored setting. ?EKG was in keeping with normal sinus rhythm (NSR). ? ?IMPRESSION: ? ?History of COPD overlapping with Obstructive Sleep Apnea (OSA), anemia, and patient intolerant of CPAP therapy.  ?Baseline study and level of OSA was determined by a Study at [place], implant followed this baseline.  ?Known severe hypoxia and REM dependent apnea would not respond to Inspire, but overall, AHI may be significantly lowered in  comparison to remaining untreated: this second activation and in lab titration found the patient at very low setting, 1.1 Volt and he was titrated to 1.4V with a reduction in AHI to 9.7/h. his oxygen nadir remained at 82%.

## 2022-04-09 ENCOUNTER — Encounter: Payer: Self-pay | Admitting: Neurology

## 2022-04-17 ENCOUNTER — Telehealth: Payer: Self-pay

## 2022-04-17 ENCOUNTER — Ambulatory Visit: Payer: Medicare HMO | Admitting: Neurology

## 2022-04-17 ENCOUNTER — Encounter: Payer: Self-pay | Admitting: Neurology

## 2022-04-17 VITALS — BP 128/57 | HR 70 | Ht 64.0 in | Wt 175.2 lb

## 2022-04-17 DIAGNOSIS — G4733 Obstructive sleep apnea (adult) (pediatric): Secondary | ICD-10-CM

## 2022-04-17 DIAGNOSIS — J449 Chronic obstructive pulmonary disease, unspecified: Secondary | ICD-10-CM

## 2022-04-17 DIAGNOSIS — Q359 Cleft palate, unspecified: Secondary | ICD-10-CM | POA: Diagnosis not present

## 2022-04-17 DIAGNOSIS — Z9682 Presence of neurostimulator: Secondary | ICD-10-CM | POA: Diagnosis not present

## 2022-04-17 DIAGNOSIS — F332 Major depressive disorder, recurrent severe without psychotic features: Secondary | ICD-10-CM

## 2022-04-17 DIAGNOSIS — G4734 Idiopathic sleep related nonobstructive alveolar hypoventilation: Secondary | ICD-10-CM

## 2022-04-17 NOTE — Progress Notes (Addendum)
? ? ?SLEEP MEDICINE CLINIC ?  ? ?Provider:  Melvyn Novas, MD  ?Primary Care Physician:  Melvenia Beam, MD ?[address]  [address] ?[place] [address]  ? ?  ? ? ?Referring Provider: Dr Jenne Pane, MD  ENT ?  ?    ?Chief Complaint according to patient   ?Patient presents with:  ?  ?   ?   RV of inspire patient - with hypoxia and mixed apnea, "I heard about inspire on TV "  ?  ? ?Rv on 04-17-22 for Devin Joseph, who underwent in lab titration for his hypoglossal nerve stimulator on 03-28-2022, revealing a suboptimal setting at 1.4 V for apnea relief.  ?He returns today with the setting of 1.1 V , as set by sleep PSGT after in lab titration. ?He tolerates the increase well but reports a sore spot on the top of the tongue, this area is white, and appears to fit the area where his tongue touches the palate. No blister is seen. The patient reports he will see a primary care provider tomorrow and bring his concern up at the visit. He had palpable Lymph node swelling at the mandible right side and reports an ongoing dental infection.  ? ?The Inspire Voltage Range for output stimulation was  reduced  to a range between 1.1-1.6 V from a wider previous setting of 0.9 to 1.9 V. ? ? ?  ? ?  ?  ?  ?  ?  ?  ?Note Devin Joseph is a 61 y.o. year old  Caucasian male patient seen on 04/17/2022 in a RV - original referral from ENT physician Dr Jenne Pane, MD. ? ?Epworth score is 7 points today.  The patient has a long psychiatric history that accounts for cyclic hypersomnia with long sleep times, often in daytime. This is not an organic finding.  ?The patient is not able or willing to adhere to bedtimes and avoiding naps in daytime  ? ?   ? ? ?History of present Illness:  ?Here is the impression from the PSG study:  ?History of COPD overlapping with Obstructive Sleep Apnea (OSA), anemia, and patient intolerant of CPAP therapy.  ?Baseline study and level of OSA was determined by a Study at [facility], implant followed this  baseline.  ?Known severe hypoxia and REM dependent apnea would not respond to Inspire, but overall, AHI may be significantly lowered in comparison to remaining untreated: this second activation and in lab titration found the patient at very low setting, 1.1 Volt and he was titrated to 1.4V with a reduction in AHI to 9.7/h. his oxygen nadir remained at 82%.   ?  ?RECOMMENDATIONS:  ?Diagnosis of REM dependent sleep apnea and COPD related hypoxia in a cleft palate patient makes Inspire a suboptimal treatment option. However, the patient did neither tolerate CPAP nor want another trial of CPAP.   ?In the recent past, this patient tolerated higher Voltage settings (1.9 and 2.1.V) with only marginal success in reduction of AHI (REM AHI!) and no impact on hypoxia. I support using the best tolerated setting in order to have him comply with some therapy at last. He will try to advance his Inspire to a 1.9V setting again. ? ?Additional use of oxygen is needed, and the patient is scheduled to follow up with his pulmonologist for this reason.  ? ?Problem remaining is suboptimal apnea control, underlying need for oxygen , and abnormal oral/ palate anatomy. ?The patient uses inspire for 7 hours  or more each day, but inconsistent. He has no established sleep/ wake rhythm and keeps irregular hours for daytime and nighttime inspire use. He also has developed a dental infection, lymph node swelling, and a sore spot on the top of his tongue.  ?This may not be INSPIRE related, but had an impact on his compliance.  ?.  ? ?  ?08-28-2021 Rv for this inspire patient who is now at level 6 and he had briefly tried at level 7 ( 1.3V) - that felt too much. Sleep quality is good, snoring is diminished. He sleeps for over 6-8 hours , some night . Therapy duration is set for 10 hours, tomorrow he has an hemorrhoidectomy. Planned for general anaesthesia. Has been in pain, interrupted sleep from that. ? Tongue motion seen at level 5 and 6 ( 1.2 V)  is good.  Demonstrated stimulation of tongue movement.  ?No changes in settings today. Devin Joseph is here to assist.   ?Will bring him back for a sleep study in the lab, starting  at 1.1 V and titrate upwards form there. This should be done in 12-16 weeks. Early January .  ?  ?07-17-2021 interval history ; Devin Joseph is followed here after consultation requested by Dr Jenne PaneBates, MD ENT . He has a cleft palate and and his inspire stimulation at currently 1.9 V has let to a left lateral protrusion of the tongue. He has called here stating that this initially comfortable setting now wakes him up and is uncomfortable. In the presence of INSPIRE Rep Devin Joseph, we trying to find the most comfortable setting and also variety of stimulation patterns, away from Plus-minus-plus setting.  ?The patient had forgotten to bring his remote control to this visit.  ?The battery was still full.  ?  ?  ? ? [name] has a past medical history of GAD/ Anxiety, Chronic kidney disease, COPD (chronic obstructive pulmonary disease) (HCC), Depression, Dyspnea, GERD (gastroesophageal reflux disease), Hypothyroidism, Pneumonia, Sleep apnea, Suicidal behavior without attempted self-injury, and Thyroid disease.. ?  and ongoing exposure to cigarette smoke - living with mother 80(78), a smoker. He is a former smoker-  ?The patient had the first [facility] the year 2005(?)- [facility]  He was prescribed a CPAP but couldn't use it- it caused dryness in mouth and nose. Sleep relevant medical history: Nocturia times 2-3 , no Tonsillectomy,he is chronically suffering from nasal congestion.   Family medical /sleep history: no other family member on CPAP with OSA, insomnia, sleep walkers.  ? ?18 January 2021 - he had been doing well with the inspire device in February and March 22- as he approached level 7, the device caused him wake up at night. He returned to level 4 and still feels bothered.  ?The patient's wife has noticed that his  tongue approaches the lower teeth line, but the patient feels as if his tongue is just buckling instead of protruding. ?He does not feel that the tongue deviates to either side.  He describes the sensation that wakes him as a discomfort not truly of pain, but he has been using the device now very irregularly over the last 4 weeks or so. ?Hypersomnia in a cyclic fashion is present, and he has been known to sleep sometimes all day for 3-5 days in a row. He never has lees than 5 hours of sleep.  ?The patient's setting on February 3 was quoted below initially at 1.9 amplitude voltage he was increased  to 2.0 the patient's own control ranges now between 1.8 and 2.4 V start delay is 30 minutes pulse is 50 minutes and therapy duration was set at 10 hours overnight.  Battery is fully charged usage per hour was 387 hours over the last and only 70 over the last week. ? ?I was able to interrogate the device and reset the therapy from 2.1 V to an amplitude of 1.8 V today the patient control will range from 1.6 through 2.3 V.  This is a reduction by 3 steps from his last setting.  The start delay, pulse time and therapy duration will remain the same.  Stimulation is plus minus plus with a pulse width of 90 rate of 33 and a maximum stimulation time of 4.  Sensation voltage was documented as beginning at 0.9 but he originally started so he is already at a higher rate today.  I also asked him to be encouraged to reduce the setting should he have discomfort at night again. ? ? ?01-18-2021/  I am meeting with Devin. Joseph and his wife, he is here for further titration on his inspire device.  The patient remains excessively daytime sleepy on his current settings of 1.9 V.  It seems that he sleeps between 12 and 15 hours a day according to his wife.  The patient has significant hearing loss which makes our communication a little bit more difficult.  ?He quit using CPAP over 5 years ago. He has still been in need of oxygen . He continued to  use tobacco.  ?   ? He does not pause the therapy at night for example for bathroom break or etc.  His wife has sometimes restarted the device at about 8 AM when she gets up in the morning at  0.7Volts.   T

## 2022-06-05 ENCOUNTER — Ambulatory Visit: Payer: Medicare HMO | Admitting: Family Medicine

## 2023-04-23 ENCOUNTER — Encounter: Payer: Self-pay | Admitting: Neurology

## 2023-04-23 ENCOUNTER — Ambulatory Visit: Payer: Medicare HMO | Admitting: Neurology

## 2023-04-23 ENCOUNTER — Telehealth: Payer: Self-pay | Admitting: Neurology

## 2023-04-23 VITALS — BP 111/74 | HR 73 | Ht 64.0 in | Wt 182.0 lb

## 2023-04-23 DIAGNOSIS — J449 Chronic obstructive pulmonary disease, unspecified: Secondary | ICD-10-CM

## 2023-04-23 DIAGNOSIS — Z9682 Presence of neurostimulator: Secondary | ICD-10-CM | POA: Diagnosis not present

## 2023-04-23 DIAGNOSIS — G4734 Idiopathic sleep related nonobstructive alveolar hypoventilation: Secondary | ICD-10-CM

## 2023-04-23 DIAGNOSIS — Z789 Other specified health status: Secondary | ICD-10-CM

## 2023-04-23 DIAGNOSIS — G4733 Obstructive sleep apnea (adult) (pediatric): Secondary | ICD-10-CM | POA: Diagnosis not present

## 2023-04-23 DIAGNOSIS — Q359 Cleft palate, unspecified: Secondary | ICD-10-CM | POA: Diagnosis not present

## 2023-04-23 NOTE — Telephone Encounter (Signed)
Patient saw Dr. Vickey Huger today for his inspire visit. Nothing was changed with his device he is happy with everything.  Please see below for his information.

## 2023-04-23 NOTE — Progress Notes (Signed)
Provider:  Melvyn Novas, MD  Primary Care Physician:  Melvenia Beam, MD [address]  [address] [place] [address]     Referring Provider: Melvenia Beam, Md [address]  [address] [place],  [place] [address]          Chief Complaint according to patient   Patient presents with:                HISTORY OF PRESENT ILLNESS:  Devin Joseph is a 62 y.o. male patient who is here for revisit 04/23/2023 for  HYPOGLOSSAL NERVE STIMULATOR _ .  Chief concern according to patient :  My tongue feels raw- it pushes against the teeth. A generic dental guard  will not wok for that, we may have to refer to dentist to cover the teeth with made to measure retainer.   No settings were changed. One ED visit due to dehydration April 2024.       Rv on 04-17-22 for Devin Joseph, who underwent in lab titration for his hypoglossal nerve stimulator on 03-28-2022, revealing a suboptimal setting at 1.4 V for apnea relief.  He returns today with the setting of 1.1 V , as set by sleep PSGT after in lab titration. He tolerates the increase well but reports a sore spot on the top of the tongue, this area is white, and appears to fit the area where his tongue touches the palate. No blister is seen. The patient reports he will see a primary care provider tomorrow and bring his concern up at the visit. He had palpable Lymph node swelling at the mandible right side and reports an ongoing dental infection.    The Inspire Voltage Range for output stimulation was  reduced  to a range between 1.1-1.6 V from a wider previous setting of 0.9 to 1.9 V.                           Note Devin Joseph is a 62 y.o. year old  Caucasian male patient seen on 04/17/2022 in a RV - original referral from ENT physician Dr Jenne Pane, MD.   Epworth score is 7 points today.  The patient has a long psychiatric history that accounts for cyclic hypersomnia with long sleep times, often in  daytime. This is not an organic finding.  The patient is not able or willing to adhere to bedtimes and avoiding naps in daytime              History of present Illness:  Here is the impression from the PSG study:  History of COPD overlapping with Obstructive Sleep Apnea (OSA), anemia, and patient intolerant of CPAP therapy.  Baseline study and level of OSA was determined by a Study at [facility], implant followed this baseline.  Known severe hypoxia and REM dependent apnea would not respond to Inspire, but overall, AHI may be significantly lowered in comparison to remaining untreated: this second activation and in lab titration found the patient at very low setting, 1.1 Volt and he was titrated to 1.4V with a reduction in AHI to 9.7/h. his oxygen nadir remained at 82%.     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 However, the patient did neither tolerate CPAP nor want another trial of CPAP.   In the  recent past, this patient tolerated higher Voltage settings (1.9 and 2.1.V) with only marginal success in reduction of AHI (REM AHI!) and no impact on hypoxia. I support using the best tolerated setting in order to have him comply with some therapy at last. He will try to advance his Inspire to a 1.9V setting again.   Additional use of oxygen is needed, and the patient is scheduled to follow up with his pulmonologist for this reason.    Problem remaining is suboptimal apnea control, underlying need for oxygen , and abnormal oral/ palate anatomy. The patient uses inspire for 7 hours or more each day, but inconsistent. He has no established sleep/ wake rhythm and keeps irregular hours for daytime and nighttime inspire use. He also has developed a dental infection, lymph node swelling, and a sore spot on the top of his tongue.  This may not be INSPIRE related, but had an impact on his  compliance.  Marland Kitchen      08-28-2021 Rv for this inspire patient who is now at level 6 and he had briefly tried at level 7 ( 1.3V) - that felt too much. Sleep quality is good, snoring is diminished. He sleeps for over 6-8 hours , some night . Therapy duration is set for 10 hours, tomorrow he has an hemorrhoidectomy. Planned for general anaesthesia. Has been in pain, interrupted sleep from that.  Tongue motion seen at level 5 and 6 ( 1.2 V) is good.  Demonstrated stimulation of tongue movement.  No changes in settings today. [name] is here to assist.   Will bring him back for a sleep study in the lab, starting  at 1.1 V and titrate upwards form there. This should be done in 12-16 weeks. Early January .    07-17-2021 interval history ; Devin Joseph is followed here after consultation requested by Dr Jenne Pane, MD ENT . He has a cleft palate and and his inspire stimulation at currently 1.9 V has let to a left lateral protrusion of the tongue. He has called here stating that this initially comfortable setting now wakes him up and is uncomfortable. In the presence of INSPIRE Rep Thayer Ohm, we trying to find the most comfortable setting and also variety of stimulation patterns, away from Plus-minus-plus setting.  The patient had forgotten to bring his remote control to this visit.  The battery was still full.         Devin Joseph has a past medical history of GAD/ Anxiety, Chronic kidney disease, COPD (chronic obstructive pulmonary disease) (HCC), Depression, Dyspnea, GERD (gastroesophageal reflux disease), Hypothyroidism, Pneumonia, Sleep apnea, Suicidal behavior without attempted self-injury, and Thyroid disease..   and ongoing exposure to cigarette smoke - living with mother (28), a smoker. He is a former smoker-  The patient had the first sleep study in the year 2005(?)- it was at  [facility].  He was prescribed a CPAP but couldn't use it- it caused dryness in mouth and nose. Sleep relevant medical history:  Nocturia times 2-3 , no Tonsillectomy,he is chronically suffering from nasal congestion.   Family medical /sleep history: no other family member on CPAP with OSA, insomnia, sleep walkers.    18 January 2021 - he had been doing well with the inspire device in February and March 22- as he approached level 7, the device caused him wake up at night. He returned to level 4 and still feels bothered.  The patient's wife has noticed that his tongue approaches the lower teeth line,  but the patient feels as if his tongue is just buckling instead of protruding. He does not feel that the tongue deviates to either side.  He describes the sensation that wakes him as a discomfort not truly of pain, but he has been using the device now very irregularly over the last 4 weeks or so. Hypersomnia in a cyclic fashion is present, and he has been known to sleep sometimes all day for 3-5 days in a row. He never has lees than 5 hours of sleep.  The patient's setting on February 3 was quoted below initially at 1.9 amplitude voltage he was increased to 2.0 the patient's own control ranges now between 1.8 and 2.4 V start delay is 30 minutes pulse is 50 minutes and therapy duration was set at 10 hours overnight.  Battery is fully charged usage per hour was 387 hours over the last and only 70 over the last week.   I was able to interrogate the device and reset the therapy from 2.1 V to an amplitude of 1.8 V today the patient control will range from 1.6 through 2.3 V.  This is a reduction by 3 steps from his last setting.  The start delay, pulse time and therapy duration will remain the same.  Stimulation is plus minus plus with a pulse width of 90 rate of 33 and a maximum stimulation time of 4.  Sensation voltage was documented as beginning at 0.9 but he originally started so he is already at a higher rate today.  I also asked him to be encouraged to reduce the setting should he have discomfort at night again.     01-18-2021/  I am  meeting with Devin. Sherril and his wife, he is here for further titration on his inspire device.  The patient remains excessively daytime sleepy on his current settings of 1.9 V.  It seems that he sleeps between 12 and 15 hours a day according to his wife.  The patient has significant hearing loss which makes our communication a little bit more difficult.  He quit using CPAP over 5 years ago. He has still been in need of oxygen . He continued to use tobacco.      He does not pause the therapy at night for example for bathroom break or etc.  His wife has sometimes restarted the device at about 8 AM when she gets up in the morning at  0.7Volts.   The amplitude at 100% of the time was 1.9 V.  The incoming patient control allows for an increase to up to 2.7 V.  His preimplant visit with US showed 25 AHI before therapy and during titration on 18 December 2020 an AHI of 4 the therapeutic level that we found was 2.2 V so he is still at this time below the recommended level.  I would like to add that his 1.9 V setting at which she is currently revealed during the sleep study the possibility for an REM AHI of 54.5/h so this was not at all enough to control his apnea.  The patient also had hypoxemia at night which may not be controlled under inspire at all.  So today we will and prolong the therapy duration from 8 to 10 hours we will set the upper limits of his voltage today for stimulation to 2.2 be set his goal to level 5 and configure rated today the patient and able to control to be between 1.8 fold and 2.5 V.  Start delay  will remain at 13-minute even more if he wanted overstimulation amplitude will be set today to 2.0 up from  1.9 Volt.    I would like to see if the patient responds with a better level of alertness after increasing dose settings current fatigue severity score was endorsed at 53 points which is very high and the Epworth sleepiness score is still at 16 points- EDS.   Will adjusted today to settings on  the inspire therapy and increase in the incoming voltage from 1.9-2.0 which brings him a little closer to the goal of 2.2 V as discovered during his sleep study on 3 January.  We will also wait until he reaches that goal before we order a home sleep test to follow-up.  We need to make sure that he does not have the same hypoxemia that was revealed in his titration study, he also had a very strong REM dependent sleep apnea which may not respond.  This may explain some of the fatigue and persistent sleepiness.  I think he is truly not at goal and we will slowly as comfortable as possibly approach the higher settings we also increase the therapy time from 8 to 10hours we discussed sleep hygiene changes, and a revisit can be done in 3 months when he should have reached the 2.2 V right therapeutic setting stage V.   12-18-2020:post INSPIRE Implant sleep study, first PSG at [facility] :DIAGNOSIS by follow up sleep study. : The previously diagnosed Obstructive Sleep Apnea responded partially to INSPIRE therapy- for NREM sleep the AHI was significantly reduced under inspire therapy. At a setting of 2.2V there was a reduction to an AHI of 5.1/h. Critical point was the consistent REM sleep apnea- not central apnea. The patient presented with REM AHI of 54.5/h at 1.9 V setting, in left lateral sleep position and with REM AHI of 45 at 2.2V setting, still in left lateral sleep position. Total desaturation time for oxygen was still high at 54 minutes, but hypoxia was not noted as a continuous finding, not lasting more than 90 seconds at a time.   PLANS/RECOMMENDATIONS: Inspire therapy at 2.2 V will control the majority of NREM sleep stage related apneas/hypopneas. REM dependent apnea remains present. Overall, the OSA is reduced by 50% and this placed the patient into a mild category of sleep apnea that can further improve with weight loss. His persistent hypoxemia is likely related to COPD and may require  additional COPD treatment or even oxygen at night.     Review of Systems: Out of a complete 14 system review, the patient complains of only the following symptoms, and all other reviewed systems are negative.:    How likely are you to doze in the following situations: 0 = not likely, 1 = slight chance, 2 = moderate chance, 3 = high chance   Sitting and Reading? Watching Television? Sitting inactive in a public place (theater or meeting)? As a passenger in a car for an hour without a break? Lying down in the afternoon when circumstances permit? Sitting and talking to someone? Sitting quietly after lunch without alcohol? In a car, while stopped for a few minutes in traffic?   Total = 1/ 24 points   FSS endorsed at xxx 63 points.   Social History   Socioeconomic History   Marital status: Married    Spouse name: Not on file   Number of children: Not on file   Years of education: Not on file  Highest education level: Not on file  Occupational History   Not on file  Tobacco Use   Smoking status: Former    Types: Cigarettes   Smokeless tobacco: Never  Vaping Use   Vaping Use: Never used  Substance and Sexual Activity   Alcohol use: Not Currently    Comment: none in 5-6 years    Drug use: No   Sexual activity: Yes    Birth control/protection: None  Other Topics Concern   Not on file  Social History Narrative   Lives at home with wife and mother   Right handed   Caffeine: about 2-3 cups/day   Social Determinants of Health   Financial Resource Strain: Not on file  Food Insecurity: Not on file  Transportation Needs: Not on file  Physical Activity: Not on file  Stress: Not on file  Social Connections: Not on file    Past Medical History:  Diagnosis Date   Anxiety    Chronic kidney disease    removed 1 kidney   COPD (chronic obstructive pulmonary disease) (HCC)    Depression    Dyspnea    GERD (gastroesophageal reflux disease)    Hypothyroidism    Pneumonia     Sleep apnea    does not use CPAP   Suicidal behavior without attempted self-injury    Thyroid disease     Past Surgical History:  Procedure Laterality Date   CHOLECYSTECTOMY     DRUG INDUCED ENDOSCOPY N/A 12/27/2019   Procedure: DRUG INDUCED ENDOSCOPY;  Surgeon: Christia Reading, MD;  Location: [facility];  Service: ENT;  Laterality: N/A;   HERNIA REPAIR     IMPLANTATION OF HYPOGLOSSAL NERVE STIMULATOR Right 08/11/2020   Procedure: IMPLANTATION OF HYPOGLOSSAL NERVE STIMULATOR;  Surgeon: Christia Reading, MD;  Location: [facility];  Service: ENT;  Laterality: Right;  REQUESTING RNFA   NEPHRECTOMY       Current Outpatient Medications on File Prior to Visit  Medication Sig Dispense Refill   acetaminophen (TYLENOL) 500 MG tablet Take 1,000-1,500 mg by mouth every 6 (six) hours as needed for headache (pain).     atorvastatin (LIPITOR) 40 MG tablet Take 1 tablet (40 mg total) by mouth at bedtime. (Patient taking differently: Take 20 mg by mouth at bedtime.) 30 tablet 0   carbamide peroxide (DEBROX) 6.5 % OTIC solution Place 2 drops into both ears every 30 (thirty) days.     Docusate Sodium (DSS) 250 MG CAPS Take by mouth.     famotidine (PEPCID) 20 MG tablet Take 20 mg by mouth daily.     levothyroxine (SYNTHROID, LEVOTHROID) 112 MCG tablet Take 1 tablet (112 mcg total) by mouth daily before breakfast. For hypothyroidism     ondansetron (ZOFRAN-ODT) 4 MG disintegrating tablet Take 4 mg by mouth every 8 (eight) hours as needed.     Polyethyl Glycol-Propyl Glycol (LUBRICANT EYE DROPS) 0.4-0.3 % SOLN Place 1 drop into both eyes daily as needed (dry/irritated eyes.).     No current facility-administered medications on file prior to visit.    Allergies  Allergen Reactions   Trazodone And Nefazodone Other (See Comments)    Causes muscle jerks/tremors per spouse   Latex Rash    NO POWDERED GLOVES, please!!     DIAGNOSTIC DATA (LABS, IMAGING, TESTING) - I reviewed  patient records, labs, notes, testing and imaging myself where available.  Lab Results  Component Value Date   WBC 6.5 08/09/2020   HGB 15.1 08/09/2020  HCT 47.4 08/09/2020   MCV 91.5 08/09/2020   PLT 140 (L) 08/09/2020      Component Value Date/Time   NA 140 11/29/2018 1920   K 3.9 11/29/2018 1920   CL 101 11/29/2018 1920   CO2 27 11/29/2018 1920   GLUCOSE 97 11/29/2018 1920   BUN 13 11/29/2018 1920   CREATININE 1.43 (H) 11/29/2018 1920   CALCIUM 8.9 11/29/2018 1920   PROT 7.5 11/29/2018 1920   ALBUMIN 4.1 11/29/2018 1920   AST 24 11/29/2018 1920   ALT 27 11/29/2018 1920   ALKPHOS 107 11/29/2018 1920   BILITOT 1.1 11/29/2018 1920   GFRNONAA 55 (L) 11/29/2018 1920   GFRAA >60 11/29/2018 1920   Lab Results  Component Value Date   CHOL 154 11/27/2017   HDL 27 (L) 11/27/2017   LDLCALC 86 11/27/2017   TRIG 206 (H) 11/27/2017   CHOLHDL 5.7 11/27/2017   Lab Results  Component Value Date   HGBA1C 5.7 (H) 11/27/2017   No results found for: "VITAMINB12" Lab Results  Component Value Date   TSH 1.109 08/02/2018    PHYSICAL EXAM:  Today's Vitals   04/23/23 0931  BP: 111/74  Pulse: 73  Weight: 182 lb (82.6 kg)  Height: 5\' 4"  (1.626 m)   Body mass index is 31.24 kg/m.   Wt Readings from Last 3 Encounters:  04/23/23 182 lb (82.6 kg)  04/17/22 175 lb 3.2 oz (79.5 kg)  08/28/21 194 lb 8 oz (88.2 kg)     Ht Readings from Last 3 Encounters:  04/23/23 5\' 4"  (1.626 m)  04/17/22 5\' 4"  (1.626 m)  08/28/21 5\' 4"  (1.626 m)    Patient saw Dr. Vickey Huger today for his inspire visit. Nothing was changed with his device he is happy with everything.   Please see below for his information.                            ASSESSMENT AND PLAN 62 y.o. year old male  here with: cleft palate, not cleft lip. Irregular dentition, small and crowded  dentition, peaked palate.  Intolerant of CPAP.   Status post Implantation of hypoglossal nerve stimulator.     1) no  residual sleepiness, no nocturia on Inspire ,  Epworth score 2 / FSS at XXX.  2) soreness of the tongue, blisters, chafing is related to the hypoglossal stimulator mechanism of  tongue thrust - address with your dentist - you may find some relief with a retainer.   Follow up through our NP within 12 months.   I would like to thank  Melvenia Beam, Md [address]  [address] [place],  [place] [address] for allowing me to meet with and to take care of this pleasant patient.   After spending a total time of  25  minutes face to face and additional time for physical and neurologic examination, review of laboratory studies,  personal review of imaging studies, reports and results of other testing and review of referral information / records as far as provided in visit,   Electronically signed by: Melvyn Novas, MD 04/23/2023 10:04 AM  [facility] Board certified by The [employer] of Sleep Medicine and Diplomate of the [employer] of Sleep Medicine. Board certified In Neurology through the ABPN, Fellow of the [employer] of Neurology. Medical Director of [facility].

## 2023-04-23 NOTE — Patient Instructions (Signed)
ASSESSMENT AND PLAN 62 y.o. year old male  here with: cleft palate, not cleft lip. Irregular dentition, small and crowded  dentition, peaked palate.  Intolerant of CPAP.   Status post Implantation of hypoglossal nerve stimulator.     1) no residual sleepiness, no nocturia on Inspire ,  Epworth score 2 / FSS at XXX.  2) soreness of the tongue, blisters, chafing is related to the hypoglossal stimulator mechanism of  tongue thrust - address with your dentist - you may find some relief with a retainer.   Follow up through our NP within 12 months.   CC; Melvenia Beam, Md [address]  [address]  [place] [address]

## 2024-04-27 ENCOUNTER — Telehealth: Payer: Self-pay | Admitting: Neurology

## 2024-04-27 NOTE — Telephone Encounter (Signed)
 Pt wife Abe Abed) cancel Pt appt due to schedule conflict.. Will call back to reschedule

## 2024-04-28 ENCOUNTER — Ambulatory Visit: Payer: Medicare HMO | Admitting: Neurology
